# Patient Record
Sex: Female | Born: 1975 | Race: Black or African American | Hispanic: No | Marital: Married | Smoking: Never smoker
Health system: Southern US, Community
[De-identification: ages and names within clinical notes are randomized; demographics above are authoritative.]

## PROBLEM LIST (undated history)

## (undated) DIAGNOSIS — I959 Hypotension, unspecified: Secondary | ICD-10-CM

## (undated) DIAGNOSIS — E559 Vitamin D deficiency, unspecified: Secondary | ICD-10-CM

## (undated) DIAGNOSIS — D573 Sickle-cell trait: Secondary | ICD-10-CM

## (undated) DIAGNOSIS — K219 Gastro-esophageal reflux disease without esophagitis: Secondary | ICD-10-CM

## (undated) DIAGNOSIS — E039 Hypothyroidism, unspecified: Secondary | ICD-10-CM

## (undated) DIAGNOSIS — D709 Neutropenia, unspecified: Secondary | ICD-10-CM

## (undated) DIAGNOSIS — G4700 Insomnia, unspecified: Secondary | ICD-10-CM

## (undated) DIAGNOSIS — M791 Myalgia, unspecified site: Secondary | ICD-10-CM

## (undated) DIAGNOSIS — D259 Leiomyoma of uterus, unspecified: Secondary | ICD-10-CM

## (undated) DIAGNOSIS — Z9889 Other specified postprocedural states: Secondary | ICD-10-CM

## (undated) HISTORY — DX: Vitamin D deficiency, unspecified: E55.9

## (undated) HISTORY — DX: Hypotension, unspecified: I95.9

## (undated) HISTORY — DX: Hypothyroidism, unspecified: E03.9

## (undated) HISTORY — DX: Other specified postprocedural states: Z98.890

## (undated) HISTORY — DX: Leiomyoma of uterus, unspecified: D25.9

## (undated) HISTORY — DX: Myalgia, unspecified site: M79.10

## (undated) HISTORY — DX: Neutropenia, unspecified: D70.9

## (undated) HISTORY — DX: Gastro-esophageal reflux disease without esophagitis: K21.9

## (undated) HISTORY — PX: HEMORRHOID SURGERY: SHX153

## (undated) HISTORY — DX: Sickle-cell trait: D57.3

## (undated) HISTORY — DX: Insomnia, unspecified: G47.00

---

## 2005-01-23 ENCOUNTER — Ambulatory Visit: Payer: Self-pay | Admitting: Obstetrics and Gynecology

## 2005-05-22 ENCOUNTER — Inpatient Hospital Stay: Payer: Self-pay | Admitting: Obstetrics and Gynecology

## 2005-12-06 ENCOUNTER — Ambulatory Visit: Payer: Self-pay | Admitting: Internal Medicine

## 2007-06-04 ENCOUNTER — Ambulatory Visit: Payer: Self-pay | Admitting: Otolaryngology

## 2007-07-16 ENCOUNTER — Ambulatory Visit: Payer: Self-pay | Admitting: Otolaryngology

## 2007-07-30 ENCOUNTER — Inpatient Hospital Stay: Payer: Self-pay | Admitting: Endocrinology

## 2007-12-11 ENCOUNTER — Ambulatory Visit: Payer: Self-pay | Admitting: Internal Medicine

## 2008-04-16 HISTORY — PX: THYROIDECTOMY: SHX17

## 2009-04-24 ENCOUNTER — Emergency Department: Payer: Self-pay | Admitting: Emergency Medicine

## 2009-04-26 ENCOUNTER — Ambulatory Visit: Payer: Self-pay | Admitting: Emergency Medicine

## 2014-04-16 NOTE — L&D Delivery Note (Signed)
Delivery Summary for [facility]  Labor Events:   Preterm labor:   Rupture date:   Rupture time:   Rupture type: Artificial  Fluid Color: Clear  Induction:   Augmentation:   Complications:   Cervical ripening:          Delivery:   Episiotomy:   Lacerations:   Repair suture:   Repair # of packets:   Blood loss (ml): 500    Information for the patient's newborn:  Lolah, Tames [id number]    Delivery 03/20/2015 5:33 PM by  Vaginal, Spontaneous Delivery Sex:  female Gestational Age: [redacted]w[redacted]d Delivery Clinician:  [place]?: Yes        APGARS  One minute Five minutes Ten minutes  Skin color: 0   1      Heart rate: 2   2      Grimace: 2   2      Muscle tone: 2   2      Breathing: 2   2      Totals: 8  9      Presentation/position: Vertex  Left Occiput Posterior Resuscitation: None  Cord information: 3 vessels   Disposition of cord blood: Yes    Blood gases sent? No Complications: None  Placenta: Delivered: 03/20/2015 5:50 PM  Spontaneous  Intact appearance Newborn Measurements: Weight: 7 lb 12.3 oz (3525 g)  Height:    Head circumference:    Chest circumference:    Other providers: Delivery Nurse Registered Nurse Venita Lick Georganna Skeans  Additional  information: Forceps:   Vacuum:   Breech:   Observed anomalies         Delivery Note At 5:33 PM a viable and healthy female was delivered via Vaginal, Spontaneous Delivery (Presentation: Left Occiput Posterior).  APGAR: 8, 9; weight 7 lb 12.3 oz (3525 g).   Placenta status: Intact, Spontaneous.  Cord: 3 vessels with the following complications: None.  Cord pH: not obtained  Anesthesia: Epidural  Episiotomy: None Lacerations: None Suture Repair: None Est. Blood Loss (mL): 500  Mom to postpartum.  Baby to Couplet care / Skin to Skin.  [name] 03/20/2015, 6:26 PM    [name] 03/20/2015, 6:11 PM

## 2014-07-23 LAB — OB RESULTS CONSOLE ANTIBODY SCREEN: Antibody Screen: NEGATIVE

## 2014-07-23 LAB — OB RESULTS CONSOLE HIV ANTIBODY (ROUTINE TESTING): HIV: NONREACTIVE

## 2014-07-23 LAB — OB RESULTS CONSOLE HGB/HCT, BLOOD
HCT: 39 %
HEMOGLOBIN: 13.1 g/dL

## 2014-07-23 LAB — OB RESULTS CONSOLE ABO/RH: RH TYPE: POSITIVE

## 2014-07-23 LAB — OB RESULTS CONSOLE GC/CHLAMYDIA
CHLAMYDIA, DNA PROBE: NEGATIVE
GC PROBE AMP, GENITAL: NEGATIVE

## 2014-07-23 LAB — OB RESULTS CONSOLE HEPATITIS B SURFACE ANTIGEN: Hepatitis B Surface Ag: NEGATIVE

## 2014-07-23 LAB — OB RESULTS CONSOLE PLATELET COUNT: Platelets: 208 10*3/uL

## 2014-07-23 LAB — OB RESULTS CONSOLE VARICELLA ZOSTER ANTIBODY, IGG: Varicella: IMMUNE

## 2014-07-23 LAB — OB RESULTS CONSOLE RUBELLA ANTIBODY, IGM: Rubella: IMMUNE

## 2014-07-23 LAB — OB RESULTS CONSOLE RPR: RPR: NONREACTIVE

## 2014-09-20 ENCOUNTER — Encounter: Payer: Self-pay | Admitting: Obstetrics and Gynecology

## 2014-09-21 ENCOUNTER — Telehealth: Payer: Self-pay

## 2014-09-21 NOTE — Telephone Encounter (Signed)
Left message for pt to call back  °

## 2014-09-21 NOTE — Telephone Encounter (Signed)
-----   Message from [name], MD sent at 09/20/2014  9:42 AM EDT ----- Regarding: Panorama results Please inform patient of normal Panorama testing, is female sex if she desires to know sex of baby.   ----- Message -----    From: [facility]    Sent: 09/20/2014   8:12 AM      To: [name], MD

## 2014-09-23 ENCOUNTER — Telehealth: Payer: Self-pay

## 2014-09-23 NOTE — Telephone Encounter (Signed)
-----   Message from [name], MD sent at 09/20/2014  9:42 AM EDT ----- Regarding: Panorama results Please inform patient of normal Panorama testing, is female sex if she desires to know sex of baby.   ----- Message -----    From: [facility]    Sent: 09/20/2014   8:12 AM      To: [name], MD

## 2014-09-23 NOTE — Telephone Encounter (Signed)
Called pt again today, initial cal 09/21/2014. No answer. Voicemail left for pt to call back.

## 2014-09-24 ENCOUNTER — Telehealth: Payer: Self-pay

## 2014-09-24 NOTE — Telephone Encounter (Signed)
-----   Message from [name], MD sent at 09/20/2014  9:42 AM EDT ----- Regarding: Panorama results Please inform patient of normal Panorama testing, is female sex if she desires to know sex of baby.   ----- Message -----    From: [facility]    Sent: 09/20/2014   8:12 AM      To: [name], MD

## 2014-09-24 NOTE — Telephone Encounter (Signed)
Called pt on cell, informed her of results below.

## 2014-09-24 NOTE — Telephone Encounter (Signed)
Pt informed of results below.

## 2014-10-13 ENCOUNTER — Ambulatory Visit (INDEPENDENT_AMBULATORY_CARE_PROVIDER_SITE_OTHER): Payer: BLUE CROSS/BLUE SHIELD | Admitting: Obstetrics and Gynecology

## 2014-10-13 ENCOUNTER — Encounter: Payer: Self-pay | Admitting: Obstetrics and Gynecology

## 2014-10-13 VITALS — BP 112/69 | HR 90 | Ht 65.0 in | Wt 178.1 lb

## 2014-10-13 DIAGNOSIS — Z3482 Encounter for supervision of other normal pregnancy, second trimester: Secondary | ICD-10-CM

## 2014-10-13 DIAGNOSIS — E039 Hypothyroidism, unspecified: Secondary | ICD-10-CM | POA: Diagnosis present

## 2014-10-13 DIAGNOSIS — Z3492 Encounter for supervision of normal pregnancy, unspecified, second trimester: Secondary | ICD-10-CM

## 2014-10-13 DIAGNOSIS — D259 Leiomyoma of uterus, unspecified: Secondary | ICD-10-CM

## 2014-10-13 NOTE — Progress Notes (Signed)
ROB: Patient denies complaints.  Nausea has finally resolved.  Patient to f/u in 2 weeks for anatomy scan, 4 weeks for ROB. Continue to monitor fibroid growth in pregnancy. Discussed need for serial ultrasounds in late 2nd and 3rd trimesters for monitoring growth of fetus and fibroids.  Fibroid palpable today, ~ 6-7 cm, fundal.

## 2014-10-20 ENCOUNTER — Other Ambulatory Visit: Payer: Self-pay

## 2014-10-20 ENCOUNTER — Ambulatory Visit: Payer: BLUE CROSS/BLUE SHIELD

## 2014-10-20 DIAGNOSIS — Z3492 Encounter for supervision of normal pregnancy, unspecified, second trimester: Secondary | ICD-10-CM

## 2014-10-20 DIAGNOSIS — Z3482 Encounter for supervision of other normal pregnancy, second trimester: Secondary | ICD-10-CM | POA: Diagnosis not present

## 2014-10-20 DIAGNOSIS — D259 Leiomyoma of uterus, unspecified: Secondary | ICD-10-CM | POA: Diagnosis not present

## 2014-11-10 ENCOUNTER — Ambulatory Visit (INDEPENDENT_AMBULATORY_CARE_PROVIDER_SITE_OTHER): Payer: BLUE CROSS/BLUE SHIELD | Admitting: Obstetrics and Gynecology

## 2014-11-10 ENCOUNTER — Encounter: Payer: Self-pay | Admitting: Obstetrics and Gynecology

## 2014-11-10 VITALS — BP 110/70 | HR 91 | Wt 183.0 lb

## 2014-11-10 DIAGNOSIS — Z3482 Encounter for supervision of other normal pregnancy, second trimester: Secondary | ICD-10-CM

## 2014-11-10 DIAGNOSIS — Z3492 Encounter for supervision of normal pregnancy, unspecified, second trimester: Secondary | ICD-10-CM

## 2014-11-10 DIAGNOSIS — Z131 Encounter for screening for diabetes mellitus: Secondary | ICD-10-CM

## 2014-11-10 DIAGNOSIS — O09522 Supervision of elderly multigravida, second trimester: Secondary | ICD-10-CM

## 2014-11-10 DIAGNOSIS — N2889 Other specified disorders of kidney and ureter: Secondary | ICD-10-CM

## 2014-11-10 DIAGNOSIS — O09529 Supervision of elderly multigravida, unspecified trimester: Secondary | ICD-10-CM | POA: Insufficient documentation

## 2014-11-10 LAB — POCT URINALYSIS DIPSTICK
BILIRUBIN UA: NEGATIVE
Blood, UA: NEGATIVE
GLUCOSE UA: NEGATIVE
Ketones, UA: NEGATIVE
Leukocytes, UA: NEGATIVE
Nitrite, UA: NEGATIVE
PH UA: 7.5
PROTEIN UA: NEGATIVE
UROBILINOGEN UA: NEGATIVE

## 2014-11-11 NOTE — Progress Notes (Signed)
ROB: Patient doing well.  Denies complaints except for mild fatigue.  Continue to monitor fetal growth with fibroid uterus.  Next scan to be at 26-[redacted] weeks gestation. RTC in 4 weeks. Most recent scan notes normal fetal growth.  But dilated renal pelvises.  Will refer to MFM for consultation and f/u.  RTC in 4 weeks.

## 2014-11-22 ENCOUNTER — Ambulatory Visit
Admission: RE | Admit: 2014-11-22 | Discharge: 2014-11-22 | Disposition: A | Discharge status: Home / Self Care | Payer: BLUE CROSS/BLUE SHIELD | Source: Ambulatory Visit | Attending: Obstetrics and Gynecology | Admitting: Obstetrics and Gynecology

## 2014-11-22 ENCOUNTER — Ambulatory Visit: Admission: RE | Admit: 2014-11-22 | Payer: BLUE CROSS/BLUE SHIELD | Source: Ambulatory Visit

## 2014-11-22 DIAGNOSIS — O09512 Supervision of elderly primigravida, second trimester: Secondary | ICD-10-CM | POA: Diagnosis not present

## 2014-11-22 DIAGNOSIS — O359XX0 Maternal care for (suspected) fetal abnormality and damage, unspecified, not applicable or unspecified: Secondary | ICD-10-CM | POA: Diagnosis present

## 2014-11-22 DIAGNOSIS — Z3A23 23 weeks gestation of pregnancy: Secondary | ICD-10-CM | POA: Insufficient documentation

## 2014-11-22 LAB — US OB DETAIL + 14 WK

## 2014-12-08 ENCOUNTER — Other Ambulatory Visit: Payer: Self-pay

## 2014-12-08 ENCOUNTER — Ambulatory Visit (INDEPENDENT_AMBULATORY_CARE_PROVIDER_SITE_OTHER): Payer: BLUE CROSS/BLUE SHIELD | Admitting: Obstetrics and Gynecology

## 2014-12-08 VITALS — BP 105/64 | HR 88 | Wt 184.4 lb

## 2014-12-08 DIAGNOSIS — O3412 Maternal care for benign tumor of corpus uteri, second trimester: Secondary | ICD-10-CM

## 2014-12-08 DIAGNOSIS — D259 Leiomyoma of uterus, unspecified: Secondary | ICD-10-CM | POA: Insufficient documentation

## 2014-12-08 DIAGNOSIS — Z3492 Encounter for supervision of normal pregnancy, unspecified, second trimester: Secondary | ICD-10-CM

## 2014-12-08 DIAGNOSIS — O09522 Supervision of elderly multigravida, second trimester: Secondary | ICD-10-CM

## 2014-12-08 DIAGNOSIS — Z131 Encounter for screening for diabetes mellitus: Secondary | ICD-10-CM

## 2014-12-08 LAB — POCT URINALYSIS DIPSTICK
Bilirubin, UA: NEGATIVE
Blood, UA: NEGATIVE
Glucose, UA: NEGATIVE
Ketones, UA: NEGATIVE
LEUKOCYTES UA: NEGATIVE
Nitrite, UA: NEGATIVE
Protein, UA: NEGATIVE
SPEC GRAV UA: 1.015
UROBILINOGEN UA: NEGATIVE
pH, UA: 5

## 2014-12-08 NOTE — Progress Notes (Signed)
ROB: Patient doing well. Notes increased pelvic pressure. Denies contractions or pain.  S/p MFM consult for dilated renal pelvises, no further workup required. Glucola performed today.  RTC in 2 weeks. To sign blood consent and Tdap next visit. For growth scan next visit.

## 2014-12-09 LAB — HEMOGLOBIN AND HEMATOCRIT, BLOOD
Hematocrit: 32.5 % — ABNORMAL LOW (ref 34.0–46.6)
Hemoglobin: 11.2 g/dL (ref 11.1–15.9)

## 2014-12-09 LAB — GLUCOSE TOLERANCE, 1 HOUR: Glucose, 1Hr PP: 85 mg/dL (ref 65–199)

## 2014-12-10 ENCOUNTER — Telehealth: Payer: Self-pay

## 2014-12-10 NOTE — Telephone Encounter (Signed)
-----   Message from [name], MD sent at 12/09/2014 11:35 AM EDT ----- Please inform patient of normal glucola screen

## 2014-12-13 NOTE — Telephone Encounter (Signed)
-----   Message from [name], MD sent at 12/09/2014 11:35 AM EDT ----- Please inform patient of normal glucola screen

## 2014-12-14 NOTE — Telephone Encounter (Signed)
Pt aware of normal glucola screen.

## 2014-12-21 ENCOUNTER — Ambulatory Visit (INDEPENDENT_AMBULATORY_CARE_PROVIDER_SITE_OTHER): Payer: BLUE CROSS/BLUE SHIELD | Admitting: Obstetrics and Gynecology

## 2014-12-21 ENCOUNTER — Encounter: Payer: Self-pay | Admitting: Obstetrics and Gynecology

## 2014-12-21 ENCOUNTER — Ambulatory Visit: Payer: BLUE CROSS/BLUE SHIELD

## 2014-12-21 VITALS — BP 102/65 | HR 94 | Wt 186.8 lb

## 2014-12-21 DIAGNOSIS — Z3482 Encounter for supervision of other normal pregnancy, second trimester: Secondary | ICD-10-CM

## 2014-12-21 DIAGNOSIS — Z3492 Encounter for supervision of normal pregnancy, unspecified, second trimester: Secondary | ICD-10-CM

## 2014-12-21 DIAGNOSIS — D259 Leiomyoma of uterus, unspecified: Secondary | ICD-10-CM

## 2014-12-21 DIAGNOSIS — O3412 Maternal care for benign tumor of corpus uteri, second trimester: Principal | ICD-10-CM

## 2014-12-21 LAB — POCT URINALYSIS DIPSTICK
Bilirubin, UA: NEGATIVE
Blood, UA: NEGATIVE
Glucose, UA: NEGATIVE
KETONES UA: NEGATIVE
LEUKOCYTES UA: NEGATIVE
Nitrite, UA: NEGATIVE
Protein, UA: NEGATIVE
SPEC GRAV UA: 1.015
Urobilinogen, UA: NEGATIVE
pH, UA: 6

## 2014-12-21 NOTE — Progress Notes (Signed)
ROB:  Patient doing well. Denies complaints.  Growth scan today with normal growth, right mildly dilated renal pelvis, large fundal fibroid (9 cm). Will consider Tdap and flu vaccine for next visit.  Blood consent signed. Needs letter stating patient can travel. Going to [place] x 1 week starting next week. Given flight precautions, PTL precautions. RTC in 2-3 weeks once returned. Final growth scan to be completed at [redacted] weeks gestation.

## 2015-01-07 ENCOUNTER — Telehealth: Payer: Self-pay | Admitting: Obstetrics and Gynecology

## 2015-01-07 ENCOUNTER — Encounter: Payer: Self-pay | Admitting: Obstetrics and Gynecology

## 2015-01-07 ENCOUNTER — Ambulatory Visit (INDEPENDENT_AMBULATORY_CARE_PROVIDER_SITE_OTHER): Payer: BLUE CROSS/BLUE SHIELD | Admitting: Obstetrics and Gynecology

## 2015-01-07 VITALS — BP 100/48 | HR 78 | Temp 99.2°F | Wt 186.6 lb

## 2015-01-07 DIAGNOSIS — Z3493 Encounter for supervision of normal pregnancy, unspecified, third trimester: Secondary | ICD-10-CM

## 2015-01-07 DIAGNOSIS — J069 Acute upper respiratory infection, unspecified: Secondary | ICD-10-CM

## 2015-01-07 LAB — POCT URINALYSIS DIPSTICK
Bilirubin, UA: NEGATIVE
GLUCOSE UA: NEGATIVE
Ketones, UA: NEGATIVE
Leukocytes, UA: NEGATIVE
Nitrite, UA: NEGATIVE
RBC UA: NEGATIVE
SPEC GRAV UA: 1.01
UROBILINOGEN UA: 0.2
pH, UA: 6

## 2015-01-07 MED ORDER — HYDROCOD POLST-CPM POLST ER 10-8 MG/5ML PO SUER: 5.0000 mL | Freq: Every evening | ORAL | Status: DC | PRN

## 2015-01-07 MED ORDER — CEFDINIR 300 MG PO CAPS: 300.0000 mg | ORAL_CAPSULE | Freq: Two times a day (BID) | ORAL | Status: DC

## 2015-01-07 NOTE — Progress Notes (Signed)
Problem OB- reports cough with green congestion x 5-6 days, malaise and sinus drainage, HA, not getting better with tylenol and robitussin, both sons have same. No concerns with pregancy. HRR Lungs with some rhonchi bilaterally and productive cough noted. Sinus clear, negative lymphadenopathy

## 2015-01-07 NOTE — Patient Instructions (Signed)
Upper Respiratory Infection, Adult An upper respiratory infection (URI) is also sometimes known as the common cold. The upper respiratory tract includes the nose, sinuses, throat, trachea, and bronchi. Bronchi are the airways leading to the lungs. Most people improve within 1 week, but symptoms can last up to 2 weeks. A residual cough may last even longer.  CAUSES Many different viruses can infect the tissues lining the upper respiratory tract. The tissues become irritated and inflamed and often become very moist. Mucus production is also common. A cold is contagious. You can easily spread the virus to others by oral contact. This includes kissing, sharing a glass, coughing, or sneezing. Touching your mouth or nose and then touching a surface, which is then touched by another person, can also spread the virus. SYMPTOMS  Symptoms typically develop 1 to 3 days after you come in contact with a cold virus. Symptoms vary from person to person. They may include:  Runny nose.  Sneezing.  Nasal congestion.  Sinus irritation.  Sore throat.  Loss of voice (laryngitis).  Cough.  Fatigue.  Muscle aches.  Loss of appetite.  Headache.  Low-grade fever. DIAGNOSIS  You might diagnose your own cold based on familiar symptoms, since most people get a cold 2 to 3 times a year. Your caregiver can confirm this based on your exam. Most importantly, your caregiver can check that your symptoms are not due to another disease such as strep throat, sinusitis, pneumonia, asthma, or epiglottitis. Blood tests, throat tests, and X-rays are not necessary to diagnose a common cold, but they may sometimes be helpful in excluding other more serious diseases. Your caregiver will decide if any further tests are required. RISKS AND COMPLICATIONS  You may be at risk for a more severe case of the common cold if you smoke cigarettes, have chronic heart disease (such as heart failure) or lung disease (such as asthma), or if  you have a weakened immune system. The very young and very old are also at risk for more serious infections. Bacterial sinusitis, middle ear infections, and bacterial pneumonia can complicate the common cold. The common cold can worsen asthma and chronic obstructive pulmonary disease (COPD). Sometimes, these complications can require emergency medical care and may be life-threatening. PREVENTION  The best way to protect against getting a cold is to practice good hygiene. Avoid oral or hand contact with people with cold symptoms. Wash your hands often if contact occurs. There is no clear evidence that vitamin C, vitamin E, echinacea, or exercise reduces the chance of developing a cold. However, it is always recommended to get plenty of rest and practice good nutrition. TREATMENT  Treatment is directed at relieving symptoms. There is no cure. Antibiotics are not effective, because the infection is caused by a virus, not by bacteria. Treatment may include:  Increased fluid intake. Sports drinks offer valuable electrolytes, sugars, and fluids.  Breathing heated mist or steam (vaporizer or shower).  Eating chicken soup or other clear broths, and maintaining good nutrition.  Getting plenty of rest.  Using gargles or lozenges for comfort.  Controlling fevers with ibuprofen or acetaminophen as directed by your caregiver.  Increasing usage of your inhaler if you have asthma. Zinc gel and zinc lozenges, taken in the first 24 hours of the common cold, can shorten the duration and lessen the severity of symptoms. Pain medicines may help with fever, muscle aches, and throat pain. A variety of non-prescription medicines are available to treat congestion and runny nose. Your caregiver   can make recommendations and may suggest nasal or lung inhalers for other symptoms.  HOME CARE INSTRUCTIONS   Only take over-the-counter or prescription medicines for pain, discomfort, or fever as directed by your  caregiver.  Use a warm mist humidifier or inhale steam from a shower to increase air moisture. This may keep secretions moist and make it easier to breathe.  Drink enough water and fluids to keep your urine clear or pale yellow.  Rest as needed.  Return to work when your temperature has returned to normal or as your caregiver advises. You may need to stay home longer to avoid infecting others. You can also use a face mask and careful hand washing to prevent spread of the virus. SEEK MEDICAL CARE IF:   After the first few days, you feel you are getting worse rather than better.  You need your caregiver's advice about medicines to control symptoms.  You develop chills, worsening shortness of breath, or brown or red sputum. These may be signs of pneumonia.  You develop yellow or brown nasal discharge or pain in the face, especially when you bend forward. These may be signs of sinusitis.  You develop a fever, swollen neck glands, pain with swallowing, or white areas in the back of your throat. These may be signs of strep throat. SEEK IMMEDIATE MEDICAL CARE IF:   You have a fever.  You develop severe or persistent headache, ear pain, sinus pain, or chest pain.  You develop wheezing, a prolonged cough, cough up blood, or have a change in your usual mucus (if you have chronic lung disease).  You develop sore muscles or a stiff neck. Document Released: 09/26/2000 Document Revised: 06/25/2011 Document Reviewed: 07/08/2013 ExitCare Patient Information 2015 [employer]. This information is not intended to replace advice given to you by your health care provider. Make sure you discuss any questions you have with your health care provider.  

## 2015-01-07 NOTE — Telephone Encounter (Signed)
Pt is 7 mos pregnant. She has a cold in her chest, thick mucus, coughing nonstop. Robitussin not helping at all. She wants to know if she can have anything else.

## 2015-01-07 NOTE — Telephone Encounter (Signed)
Pt states she has had a cold x 6 days. Thick green mucous. Coughing all nite and unable to sleep. Chest hurts from all the coughing. Runny nose. NO fever or body aches. Plain robutussin is not helping. Appt made TODAY. Ok per [facility].

## 2015-01-07 NOTE — Progress Notes (Signed)
OB work in- c/o sinus drainage x 1 week, chest congestion- thick green phlem

## 2015-01-12 ENCOUNTER — Ambulatory Visit (INDEPENDENT_AMBULATORY_CARE_PROVIDER_SITE_OTHER): Payer: BLUE CROSS/BLUE SHIELD | Admitting: Obstetrics and Gynecology

## 2015-01-12 ENCOUNTER — Encounter: Payer: Self-pay | Admitting: Obstetrics and Gynecology

## 2015-01-12 VITALS — BP 113/68 | HR 82 | Wt 188.4 lb

## 2015-01-12 DIAGNOSIS — O09523 Supervision of elderly multigravida, third trimester: Secondary | ICD-10-CM

## 2015-01-12 DIAGNOSIS — D259 Leiomyoma of uterus, unspecified: Secondary | ICD-10-CM

## 2015-01-12 DIAGNOSIS — Z3493 Encounter for supervision of normal pregnancy, unspecified, third trimester: Secondary | ICD-10-CM

## 2015-01-12 DIAGNOSIS — J069 Acute upper respiratory infection, unspecified: Secondary | ICD-10-CM

## 2015-01-12 DIAGNOSIS — O3410 Maternal care for benign tumor of corpus uteri, unspecified trimester: Secondary | ICD-10-CM

## 2015-01-12 LAB — POCT URINALYSIS DIPSTICK
BILIRUBIN UA: NEGATIVE
Blood, UA: NEGATIVE
Glucose, UA: NEGATIVE
KETONES UA: NEGATIVE
LEUKOCYTES UA: NEGATIVE
Nitrite, UA: NEGATIVE
Protein, UA: NEGATIVE
Urobilinogen, UA: 0.2
pH, UA: 7

## 2015-01-12 MED ORDER — DOCUSATE SODIUM 100 MG PO CAPS: 100.0000 mg | ORAL_CAPSULE | Freq: Two times a day (BID) | ORAL | Status: DC

## 2015-01-12 NOTE — Progress Notes (Signed)
Pt c/o of constipation. Constipation protocol given. D/t sickness will give vaccines NV.

## 2015-01-13 NOTE — Progress Notes (Signed)
ROB: Patient reports cold symptoms improving. For next growth scan at 34-36 weeks for large uterine fibroid in pregnancy. RTC in 2 weeks. For Tdap and flu vaccine after URI symptoms resolved.

## 2015-01-26 ENCOUNTER — Ambulatory Visit (INDEPENDENT_AMBULATORY_CARE_PROVIDER_SITE_OTHER): Payer: BLUE CROSS/BLUE SHIELD | Admitting: Obstetrics and Gynecology

## 2015-01-26 VITALS — BP 101/65 | HR 92 | Wt 191.6 lb

## 2015-01-26 DIAGNOSIS — O3413 Maternal care for benign tumor of corpus uteri, third trimester: Secondary | ICD-10-CM

## 2015-01-26 DIAGNOSIS — Z23 Encounter for immunization: Secondary | ICD-10-CM

## 2015-01-26 DIAGNOSIS — D259 Leiomyoma of uterus, unspecified: Secondary | ICD-10-CM

## 2015-01-26 DIAGNOSIS — Z331 Pregnant state, incidental: Secondary | ICD-10-CM

## 2015-01-26 LAB — POCT URINALYSIS DIPSTICK
Bilirubin, UA: NEGATIVE
Blood, UA: NEGATIVE
Glucose, UA: NEGATIVE
Ketones, UA: NEGATIVE
Leukocytes, UA: NEGATIVE
Nitrite, UA: NEGATIVE
Protein, UA: NEGATIVE
Spec Grav, UA: 1.01
Urobilinogen, UA: NEGATIVE
pH, UA: 7.5

## 2015-01-26 MED ORDER — TETANUS-DIPHTH-ACELL PERTUSSIS 5-2.5-18.5 LF-MCG/0.5 IM SUSP
0.5000 mL | Freq: Once | INTRAMUSCULAR | Status: AC
  Administered 2015-01-26: 0.5 mL via INTRAMUSCULAR

## 2015-01-26 NOTE — Progress Notes (Signed)
ROB: Denies complaints. Does note occasional Braxton hicks. Tdap given, declines flu vaccine.  For growth scan next visit for fibroid uterus in pregnancy. PTL precautions given.  Desires to discuss pain management with anesthesia due to complications/SE from epidural last pregnancy. Will arrange. RTC in 2 weeks.

## 2015-01-26 NOTE — Progress Notes (Signed)
Pt wants to wait till next visit to get flu vaccine but is really unsure. Will give Tdap injection.

## 2015-02-01 ENCOUNTER — Telehealth: Payer: Self-pay | Admitting: *Deleted

## 2015-02-01 NOTE — OR Nursing (Addendum)
Spoke with patient at request of Dr Marcelline Mates. Patient had epidural 2007 for delivery and experienced itching for a week. Strongest dose of Benadryl did not help. Obtained labor and delivery summary and MAR. Spoke with Dr Assunta Gambles who requested patient not be given Astramorph.if epidural this delivery. She is due 03/15/15. Notified patient and added to allergies.

## 2015-02-10 ENCOUNTER — Ambulatory Visit: Payer: BLUE CROSS/BLUE SHIELD

## 2015-02-10 ENCOUNTER — Ambulatory Visit (INDEPENDENT_AMBULATORY_CARE_PROVIDER_SITE_OTHER): Payer: BLUE CROSS/BLUE SHIELD | Admitting: Obstetrics and Gynecology

## 2015-02-10 VITALS — BP 106/71 | HR 89 | Wt 187.8 lb

## 2015-02-10 DIAGNOSIS — D259 Leiomyoma of uterus, unspecified: Secondary | ICD-10-CM

## 2015-02-10 DIAGNOSIS — O3410 Maternal care for benign tumor of corpus uteri, unspecified trimester: Secondary | ICD-10-CM

## 2015-02-10 DIAGNOSIS — O3413 Maternal care for benign tumor of corpus uteri, third trimester: Principal | ICD-10-CM

## 2015-02-10 DIAGNOSIS — Z3493 Encounter for supervision of normal pregnancy, unspecified, third trimester: Secondary | ICD-10-CM

## 2015-02-10 DIAGNOSIS — O09523 Supervision of elderly multigravida, third trimester: Secondary | ICD-10-CM

## 2015-02-10 LAB — POCT URINALYSIS DIP (MANUAL ENTRY)
Bilirubin, UA: NEGATIVE
Blood, UA: NEGATIVE
Glucose, UA: NEGATIVE
Ketones, POC UA: NEGATIVE
Leukocytes, UA: NEGATIVE
Nitrite, UA: NEGATIVE
Protein Ur, POC: NEGATIVE
Spec Grav, UA: 1.015
Urobilinogen, UA: 0.2
pH, UA: 7

## 2015-02-10 MED ORDER — AMEDA ELITE BREAST PUMP MISC: 1.0000 | Status: DC | PRN

## 2015-02-10 NOTE — Progress Notes (Signed)
ROB: Denies major complaints.  S/p growth scan with growth in 42%ile (5 lb 11 oz), fibroids enlarging (largest one 11 cm), transverse fetal presentation. 36 week labs done today.  Discussed fetal presentation, can watch for additional 2 weeks to see if fetus changes to vertex.  Otherwise may have to discuss ECV vs C-section as mode of delivery. RTC in 1 week.

## 2015-02-14 LAB — CULTURE, BETA STREP (GROUP B ONLY): Strep Gp B Culture: NEGATIVE

## 2015-02-15 LAB — NUSWAB VAGINITIS PLUS (VG+)

## 2015-02-16 ENCOUNTER — Encounter: Payer: BLUE CROSS/BLUE SHIELD | Admitting: Obstetrics and Gynecology

## 2015-02-24 ENCOUNTER — Ambulatory Visit (INDEPENDENT_AMBULATORY_CARE_PROVIDER_SITE_OTHER): Payer: BLUE CROSS/BLUE SHIELD | Admitting: Obstetrics and Gynecology

## 2015-02-24 VITALS — BP 110/65 | HR 87 | Wt 191.1 lb

## 2015-02-24 DIAGNOSIS — O3410 Maternal care for benign tumor of corpus uteri, unspecified trimester: Secondary | ICD-10-CM

## 2015-02-24 DIAGNOSIS — D259 Leiomyoma of uterus, unspecified: Secondary | ICD-10-CM

## 2015-02-24 DIAGNOSIS — Z3493 Encounter for supervision of normal pregnancy, unspecified, third trimester: Secondary | ICD-10-CM

## 2015-02-24 DIAGNOSIS — O09523 Supervision of elderly multigravida, third trimester: Secondary | ICD-10-CM

## 2015-02-24 DIAGNOSIS — O321XX0 Maternal care for breech presentation, not applicable or unspecified: Secondary | ICD-10-CM

## 2015-02-24 LAB — POCT URINALYSIS DIPSTICK
Bilirubin, UA: NEGATIVE
Blood, UA: NEGATIVE
Glucose, UA: NEGATIVE
KETONES UA: 5
Leukocytes, UA: NEGATIVE
Nitrite, UA: NEGATIVE
PH UA: 6.5
PROTEIN UA: NEGATIVE
SPEC GRAV UA: 1.01
UROBILINOGEN UA: NEGATIVE

## 2015-02-24 NOTE — Progress Notes (Signed)
ROB: Patient doing well, notes irregular ctx and pressure.  Inquires about private cord blood banking. Given information.  36 week labs including GBS.  Labor precautions.  Will order [place] for presentation as unable to determine on today's exam. Was transverse lie on prior scan 2 weeks ago.   Discussed possibility of attempting ECV, including risks/benefits.  RTC in 1 week.

## 2015-03-03 ENCOUNTER — Ambulatory Visit: Payer: BLUE CROSS/BLUE SHIELD

## 2015-03-03 ENCOUNTER — Ambulatory Visit (INDEPENDENT_AMBULATORY_CARE_PROVIDER_SITE_OTHER): Payer: BLUE CROSS/BLUE SHIELD | Admitting: Obstetrics and Gynecology

## 2015-03-03 VITALS — BP 112/66 | HR 85 | Wt 189.0 lb

## 2015-03-03 DIAGNOSIS — D259 Leiomyoma of uterus, unspecified: Secondary | ICD-10-CM

## 2015-03-03 DIAGNOSIS — O321XX0 Maternal care for breech presentation, not applicable or unspecified: Secondary | ICD-10-CM | POA: Diagnosis not present

## 2015-03-03 DIAGNOSIS — Z3493 Encounter for supervision of normal pregnancy, unspecified, third trimester: Secondary | ICD-10-CM

## 2015-03-03 DIAGNOSIS — O3410 Maternal care for benign tumor of corpus uteri, unspecified trimester: Secondary | ICD-10-CM

## 2015-03-03 DIAGNOSIS — O09523 Supervision of elderly multigravida, third trimester: Secondary | ICD-10-CM

## 2015-03-03 LAB — POCT URINALYSIS DIPSTICK
Bilirubin, UA: NEGATIVE
Blood, UA: NEGATIVE
GLUCOSE UA: NEGATIVE
Ketones, UA: NEGATIVE
LEUKOCYTES UA: NEGATIVE
Nitrite, UA: NEGATIVE
PH UA: 7.5
Protein, UA: NEGATIVE
Spec Grav, UA: 1.01
UROBILINOGEN UA: NEGATIVE

## 2015-03-03 NOTE — Progress Notes (Signed)
ROB: Patient notes no sleep overnight, very uncomfortable. Cervical change noted.  Presentation scan notes fetus now vertex.  Labor precautions and fetal kick counts given. RTC in 1 week. Marland Kitchen

## 2015-03-09 ENCOUNTER — Encounter: Payer: Self-pay | Admitting: Obstetrics and Gynecology

## 2015-03-09 ENCOUNTER — Ambulatory Visit (INDEPENDENT_AMBULATORY_CARE_PROVIDER_SITE_OTHER): Payer: BLUE CROSS/BLUE SHIELD | Admitting: Obstetrics and Gynecology

## 2015-03-09 VITALS — BP 115/73 | HR 118 | Wt 190.0 lb

## 2015-03-09 DIAGNOSIS — Z3490 Encounter for supervision of normal pregnancy, unspecified, unspecified trimester: Secondary | ICD-10-CM

## 2015-03-09 DIAGNOSIS — Z331 Pregnant state, incidental: Secondary | ICD-10-CM

## 2015-03-09 DIAGNOSIS — O09523 Supervision of elderly multigravida, third trimester: Secondary | ICD-10-CM

## 2015-03-09 DIAGNOSIS — O3410 Maternal care for benign tumor of corpus uteri, unspecified trimester: Secondary | ICD-10-CM

## 2015-03-09 DIAGNOSIS — D259 Leiomyoma of uterus, unspecified: Secondary | ICD-10-CM

## 2015-03-09 LAB — POCT URINALYSIS DIPSTICK
Bilirubin, UA: NEGATIVE
Glucose, UA: NEGATIVE
Leukocytes, UA: NEGATIVE
Nitrite, UA: NEGATIVE
PROTEIN UA: NEGATIVE
RBC UA: NEGATIVE
SPEC GRAV UA: 1.025
UROBILINOGEN UA: NEGATIVE
pH, UA: 6

## 2015-03-09 NOTE — Progress Notes (Signed)
ROB: Patient doing well, notes irregular contractions.  Cervix unchanged since prior visit.  Labor precautions given.  RTC in1 week if undelivered.

## 2015-03-13 LAB — OB RESULTS CONSOLE GBS: STREP GROUP B AG: NEGATIVE

## 2015-03-16 ENCOUNTER — Ambulatory Visit (INDEPENDENT_AMBULATORY_CARE_PROVIDER_SITE_OTHER): Payer: BLUE CROSS/BLUE SHIELD | Admitting: Obstetrics and Gynecology

## 2015-03-16 VITALS — BP 101/64 | HR 81 | Wt 192.1 lb

## 2015-03-16 DIAGNOSIS — Z3403 Encounter for supervision of normal first pregnancy, third trimester: Secondary | ICD-10-CM

## 2015-03-16 NOTE — Progress Notes (Signed)
ROB: Patient notes contractions, however not consistent. Cervix remains unchanged (very posterior now) and unable to determine fetal presentation.  Ultrasound notes cephalic presentation.  Patient now inquires about elective IOL as she now desires.  Scheduled for 03/19/2015 (evening).

## 2015-03-19 ENCOUNTER — Inpatient Hospital Stay
Admission: EM | Admit: 2015-03-19 | Discharge: 2015-03-22 | DRG: 774 | Disposition: A | Discharge status: Home / Self Care | Payer: BLUE CROSS/BLUE SHIELD | Attending: Obstetrics and Gynecology | Admitting: Obstetrics and Gynecology

## 2015-03-19 DIAGNOSIS — E039 Hypothyroidism, unspecified: Secondary | ICD-10-CM | POA: Diagnosis present

## 2015-03-19 DIAGNOSIS — Z8249 Family history of ischemic heart disease and other diseases of the circulatory system: Secondary | ICD-10-CM

## 2015-03-19 DIAGNOSIS — O480 Post-term pregnancy: Secondary | ICD-10-CM | POA: Diagnosis present

## 2015-03-19 DIAGNOSIS — O9902 Anemia complicating childbirth: Secondary | ICD-10-CM | POA: Diagnosis present

## 2015-03-19 DIAGNOSIS — O9912 Other diseases of the blood and blood-forming organs and certain disorders involving the immune mechanism complicating childbirth: Principal | ICD-10-CM | POA: Diagnosis present

## 2015-03-19 DIAGNOSIS — Z800 Family history of malignant neoplasm of digestive organs: Secondary | ICD-10-CM

## 2015-03-19 DIAGNOSIS — Z9889 Other specified postprocedural states: Secondary | ICD-10-CM

## 2015-03-19 DIAGNOSIS — D259 Leiomyoma of uterus, unspecified: Secondary | ICD-10-CM | POA: Diagnosis present

## 2015-03-19 DIAGNOSIS — Z3483 Encounter for supervision of other normal pregnancy, third trimester: Secondary | ICD-10-CM | POA: Diagnosis not present

## 2015-03-19 DIAGNOSIS — Z79899 Other long term (current) drug therapy: Secondary | ICD-10-CM

## 2015-03-19 DIAGNOSIS — O99284 Endocrine, nutritional and metabolic diseases complicating childbirth: Secondary | ICD-10-CM | POA: Diagnosis present

## 2015-03-19 DIAGNOSIS — O09529 Supervision of elderly multigravida, unspecified trimester: Secondary | ICD-10-CM

## 2015-03-19 DIAGNOSIS — Z3A40 40 weeks gestation of pregnancy: Secondary | ICD-10-CM | POA: Diagnosis not present

## 2015-03-19 DIAGNOSIS — D696 Thrombocytopenia, unspecified: Secondary | ICD-10-CM | POA: Diagnosis present

## 2015-03-19 DIAGNOSIS — O99119 Other diseases of the blood and blood-forming organs and certain disorders involving the immune mechanism complicating pregnancy, unspecified trimester: Secondary | ICD-10-CM

## 2015-03-19 DIAGNOSIS — D6959 Other secondary thrombocytopenia: Secondary | ICD-10-CM | POA: Diagnosis present

## 2015-03-19 DIAGNOSIS — O3413 Maternal care for benign tumor of corpus uteri, third trimester: Secondary | ICD-10-CM | POA: Diagnosis present

## 2015-03-19 DIAGNOSIS — Z888 Allergy status to other drugs, medicaments and biological substances status: Secondary | ICD-10-CM | POA: Diagnosis not present

## 2015-03-19 LAB — CBC
HCT: 33.7 % — ABNORMAL LOW (ref 35.0–47.0)
HEMOGLOBIN: 11.3 g/dL — AB (ref 12.0–16.0)
MCH: 27.6 pg (ref 26.0–34.0)
MCHC: 33.4 g/dL (ref 32.0–36.0)
MCV: 82.6 fL (ref 80.0–100.0)
PLATELETS: 125 10*3/uL — AB (ref 150–440)
RBC: 4.08 MIL/uL (ref 3.80–5.20)
RDW: 14.1 % (ref 11.5–14.5)
WBC: 6.6 10*3/uL (ref 3.6–11.0)

## 2015-03-19 LAB — TYPE AND SCREEN
ABO/RH(D): B POS
Antibody Screen: NEGATIVE

## 2015-03-19 LAB — ABO/RH: ABO/RH(D): B POS

## 2015-03-19 MED ORDER — OXYTOCIN BOLUS FROM INFUSION: 500.0000 mL | INTRAVENOUS | Status: DC

## 2015-03-19 MED ORDER — LACTATED RINGERS IV SOLN: 500.0000 mL | INTRAVENOUS | Status: DC | PRN

## 2015-03-19 MED ORDER — ACETAMINOPHEN 325 MG PO TABS: 650.0000 mg | ORAL_TABLET | ORAL | Status: DC | PRN

## 2015-03-19 MED ORDER — BUTORPHANOL TARTRATE 1 MG/ML IJ SOLN: 1.0000 mg | INTRAMUSCULAR | Status: DC | PRN

## 2015-03-19 MED ORDER — OXYTOCIN 40 UNITS IN LACTATED RINGERS INFUSION - SIMPLE MED
1.0000 m[IU]/min | INTRAVENOUS | Status: DC
  Administered 2015-03-20: 1 m[IU]/min via INTRAVENOUS
  Filled 2015-03-19: qty 1000

## 2015-03-19 MED ORDER — OXYTOCIN 40 UNITS IN LACTATED RINGERS INFUSION - SIMPLE MED: 62.5000 mL/h | INTRAVENOUS | Status: DC

## 2015-03-19 MED ORDER — LACTATED RINGERS IV SOLN
INTRAVENOUS | Status: DC
  Administered 2015-03-20 (×2): 125 mL/h via INTRAVENOUS

## 2015-03-19 MED ORDER — CITRIC ACID-SODIUM CITRATE 334-500 MG/5ML PO SOLN: 30.0000 mL | ORAL | Status: DC | PRN

## 2015-03-19 MED ORDER — LIDOCAINE HCL (PF) 1 % IJ SOLN
30.0000 mL | INTRAMUSCULAR | Status: DC | PRN
  Filled 2015-03-19: qty 30

## 2015-03-19 MED ORDER — TERBUTALINE SULFATE 1 MG/ML IJ SOLN: 0.2500 mg | Freq: Once | INTRAMUSCULAR | Status: DC | PRN

## 2015-03-19 MED ORDER — ONDANSETRON HCL 4 MG/2ML IJ SOLN: 4.0000 mg | Freq: Four times a day (QID) | INTRAMUSCULAR | Status: DC | PRN

## 2015-03-19 NOTE — H&P (Signed)
Obstetric History and Physical  Emily Howard is a 39 y.o. [id number] with IUP at [redacted]w[redacted]d presenting for elective IOL. Patient states she has been having  irregular, every 5-10 minutes contractions, none vaginal bleeding, intact membranes, with active fetal movement.    Prenatal Course Source of Care: [facility] with onset of care at 10 weeks Pregnancy complications or risks: Patient Active Problem List   Diagnosis Date Noted  . Post-dates pregnancy 03/19/2015  . Uterine fibroid in pregnancy 12/08/2014  . Advanced maternal age in multigravida 11/10/2014  . Hypothyroidism 10/13/2014   She plans to breastfeed She is undecided for postpartum contraception.   Prenatal labs and studies: ABO, Rh: B/Positive/-- (04/08 0000) Antibody: Negative (04/08 0000) Rubella: Immune (04/08 0000) RPR: Nonreactive (04/08 0000)  HBsAg: Negative (04/08 0000)  HIV: Non-reactive (04/08 0000)  [id number] (11/27 0000) 1 hr Glucola  normal Genetic screening normal Anatomy US Normal, with large uterine fibroids  Prenatal Transfer Tool  Maternal Diabetes: No Genetic Screening: Normal Maternal Ultrasounds/Referrals: Abnormal:  Findings:   Fetal renal pyelectasis Fetal Ultrasounds or other Referrals:  Referred to Materal Fetal Medicine  Maternal Substance Abuse:  No Significant Maternal Medications:  None Significant Maternal Lab Results: None  Past Medical History  Diagnosis Date  . Thyroid disease   . H/O hemorrhoidectomy   . Neutropenia ([place])   . Insomnia   . GERD (gastroesophageal reflux disease)   . Myalgia   . Fibroid, uterine   . Hypothyroidism   . AMA (advanced maternal age) multigravida 66+   . Hypotension   . Vitamin D deficiency     Past Surgical History  Procedure Laterality Date  . Thyroidectomy  2010  . Hemorrhoid surgery      OB History  Gravida Para Term Preterm AB SAB TAB Ectopic Multiple Living  3 2 2       2     # Outcome Date GA Lbr Len/2nd Weight Sex  Delivery Anes PTL Lv  3 Current           2 Term 2007   10 lb (4.536 kg) M Vag-Spont   Y  1 Term 2005   7 lb 6 oz (3.345 kg) M Vag-Spont   Y      Social History   Social History  . Marital Status: Married    Spouse Name: N/A  . Number of Children: N/A  . Years of Education: N/A   Social History Main Topics  . Smoking status: Never Smoker   . Smokeless tobacco: Never Used  . Alcohol Use: No  . Drug Use: No  . Sexual Activity: Yes    Birth Control/ Protection: None     Comment: Pregnant   Other Topics Concern  . Not on file   Social History Narrative    Family History  Problem Relation Age of Onset  . Hypertension Mother   . Pancreatic cancer Father     Prescriptions prior to admission  Medication Sig Dispense Refill Last Dose  . docusate sodium (COLACE) 100 MG capsule Take 1 capsule (100 mg total) by mouth 2 (two) times daily. 60 capsule 2 Taking  . levothyroxine (SYNTHROID, LEVOTHROID) 175 MCG tablet Take 175 mcg by mouth daily before breakfast.   Taking  . Misc. Devices (AMEDA ELITE BREAST PUMP) MISC 1 each by Does not apply route as needed. 1 each 0 Taking  . Prenat w/o A Vit-FeFum-FePo-FA (PROVIDA OB) 20-20-1.25 MG CAPS Take 1 capsule by mouth daily.  12 Taking  Allergies  Allergen Reactions  . Astramorph [Morphine] Itching    Itching for a week  . Chloroquine Hives and Itching    Review of Systems: Negative except for what is mentioned in HPI.  Physical Exam: There were no vitals taken for this visit. CONSTITUTIONAL: Well-developed, well-nourished female in no acute distress.  HENT:  Normocephalic, atraumatic, External right and left ear normal. Oropharynx is clear and moist EYES: Conjunctivae and EOM are normal. Pupils are equal, round, and reactive to light. No scleral icterus.  NECK: Normal range of motion, supple, no masses SKIN: Skin is warm and dry. No rash noted. Not diaphoretic. No erythema. No pallor. [place]: Alert and oriented to person,  place, and time. Normal reflexes, muscle tone coordination. No cranial nerve deficit noted. PSYCHIATRIC: Normal mood and affect. Normal behavior. Normal judgment and thought content. CARDIOVASCULAR: Normal heart rate noted, regular rhythm RESPIRATORY: Effort and breath sounds normal, no problems with respiration noted ABDOMEN: Soft, nontender, nondistended, gravid,. Irregular contours, fibroids palpable. MUSCULOSKELETAL: Normal range of motion. No edema and no tenderness. 2+ distal pulses.  Cervical Exam: Dilatation 3cm   Effacement 50%   Station -3   Presentation: cephalic FHT:  Baseline rate 160 bpm   Variability moderate  Accelerations present   Decelerations none Contractions: infrequent (> 10 min)   Pertinent Labs/Studies:   Results for orders placed or performed during the hospital encounter of 03/19/15 (from the past 24 hour(s))  CBC     Status: Abnormal   Collection Time: 03/19/15  9:14 PM  Result Value Ref Range   WBC 6.6 3.6 - 11.0 K/uL   RBC 4.08 3.80 - 5.20 MIL/uL   Hemoglobin 11.3 (L) 12.0 - 16.0 g/dL   HCT 33.7 (L) 35.0 - 47.0 %   MCV 82.6 80.0 - 100.0 fL   MCH 27.6 26.0 - 34.0 pg   MCHC 33.4 32.0 - 36.0 g/dL   RDW 14.1 11.5 - 14.5 %   Platelets 125 (L) 150 - 440 K/uL  Type and screen     Status: None   Collection Time: 03/19/15  9:14 PM  Result Value Ref Range   ABO/RH(D) B POS    Antibody Screen NEG    Sample Expiration 03/22/2015   ABO/Rh     Status: None   Collection Time: 03/19/15  9:14 PM  Result Value Ref Range   ABO/RH(D) B POS     Assessment : [name] is a 39 y.o. G3P2002 at [redacted]w[redacted]d being admitted for postdates, desiring elective IOL.  Also with thrombocytopenia (likely gestational).  Plan: Labor: Active management.  Induction per protocol with Pitocin FWB: Reassuring fetal heart tracing.  GBS negative Delivery plan: Hopeful for vaginal delivery   [name], MD [facility] 03/19/2015 10:25 PM

## 2015-03-20 ENCOUNTER — Inpatient Hospital Stay: Payer: BLUE CROSS/BLUE SHIELD | Admitting: Anesthesiology

## 2015-03-20 DIAGNOSIS — Z3483 Encounter for supervision of other normal pregnancy, third trimester: Secondary | ICD-10-CM

## 2015-03-20 MED ORDER — ROPIVACAINE HCL 2 MG/ML IJ SOLN
10.0000 mL/h | INTRAMUSCULAR | Status: DC
  Administered 2015-03-20: 10 mL/h via EPIDURAL
  Filled 2015-03-20 (×2): qty 200

## 2015-03-20 MED ORDER — PRENATAL MULTIVITAMIN CH
1.0000 | ORAL_TABLET | Freq: Every day | ORAL | Status: DC
  Administered 2015-03-21 – 2015-03-22 (×2): 1 via ORAL
  Filled 2015-03-20 (×2): qty 1

## 2015-03-20 MED ORDER — MISOPROSTOL 200 MCG PO TABS: 800.0000 ug | ORAL_TABLET | Freq: Once | ORAL | Status: DC

## 2015-03-20 MED ORDER — ONDANSETRON HCL 4 MG PO TABS: 4.0000 mg | ORAL_TABLET | ORAL | Status: DC | PRN

## 2015-03-20 MED ORDER — IBUPROFEN 600 MG PO TABS
600.0000 mg | ORAL_TABLET | Freq: Four times a day (QID) | ORAL | Status: DC
  Administered 2015-03-20 – 2015-03-22 (×7): 600 mg via ORAL
  Filled 2015-03-20 (×6): qty 1

## 2015-03-20 MED ORDER — ZOLPIDEM TARTRATE 5 MG PO TABS: 5.0000 mg | ORAL_TABLET | Freq: Every evening | ORAL | Status: DC | PRN

## 2015-03-20 MED ORDER — ONDANSETRON HCL 4 MG/2ML IJ SOLN: 4.0000 mg | INTRAMUSCULAR | Status: DC | PRN

## 2015-03-20 MED ORDER — DIBUCAINE 1 % RE OINT
1.0000 "application " | TOPICAL_OINTMENT | RECTAL | Status: DC | PRN
  Filled 2015-03-20: qty 28

## 2015-03-20 MED ORDER — METHYLERGONOVINE MALEATE 0.2 MG/ML IJ SOLN
0.2000 mg | INTRAMUSCULAR | Status: DC | PRN
  Filled 2015-03-20: qty 1

## 2015-03-20 MED ORDER — MISOPROSTOL 200 MCG PO TABS
ORAL_TABLET | ORAL | Status: AC
  Filled 2015-03-20: qty 4

## 2015-03-20 MED ORDER — OXYTOCIN 10 UNIT/ML IJ SOLN
INTRAMUSCULAR | Status: AC
  Filled 2015-03-20: qty 2

## 2015-03-20 MED ORDER — METHYLERGONOVINE MALEATE 0.2 MG/ML IJ SOLN: 0.2000 mg | Freq: Once | INTRAMUSCULAR | Status: DC

## 2015-03-20 MED ORDER — BUPIVACAINE HCL (PF) 0.25 % IJ SOLN
INTRAMUSCULAR | Status: DC | PRN
  Administered 2015-03-20: 10 mL via EPIDURAL

## 2015-03-20 MED ORDER — OXYCODONE-ACETAMINOPHEN 5-325 MG PO TABS
1.0000 | ORAL_TABLET | ORAL | Status: DC | PRN
  Administered 2015-03-21 – 2015-03-22 (×2): 1 via ORAL
  Filled 2015-03-20 (×2): qty 1

## 2015-03-20 MED ORDER — LOPERAMIDE HCL 2 MG PO CAPS
4.0000 mg | ORAL_CAPSULE | Freq: Once | ORAL | Status: AC
  Administered 2015-03-20: 4 mg via ORAL
  Filled 2015-03-20: qty 2

## 2015-03-20 MED ORDER — AMMONIA AROMATIC IN INHA
RESPIRATORY_TRACT | Status: AC
  Filled 2015-03-20: qty 10

## 2015-03-20 MED ORDER — DIPHENHYDRAMINE HCL 25 MG PO CAPS: 25.0000 mg | ORAL_CAPSULE | Freq: Four times a day (QID) | ORAL | Status: DC | PRN

## 2015-03-20 MED ORDER — CARBOPROST TROMETHAMINE 250 MCG/ML IM SOLN
INTRAMUSCULAR | Status: AC
  Administered 2015-03-20: 250 ug
  Filled 2015-03-20: qty 1

## 2015-03-20 MED ORDER — SENNOSIDES-DOCUSATE SODIUM 8.6-50 MG PO TABS
2.0000 | ORAL_TABLET | ORAL | Status: DC
  Filled 2015-03-20 (×2): qty 2

## 2015-03-20 MED ORDER — METHYLERGONOVINE MALEATE 0.2 MG PO TABS: 0.2000 mg | ORAL_TABLET | ORAL | Status: DC | PRN

## 2015-03-20 MED ORDER — SIMETHICONE 80 MG PO CHEW: 80.0000 mg | CHEWABLE_TABLET | ORAL | Status: DC | PRN

## 2015-03-20 MED ORDER — LANOLIN HYDROUS EX OINT: TOPICAL_OINTMENT | CUTANEOUS | Status: DC | PRN

## 2015-03-20 MED ORDER — LEVOTHYROXINE SODIUM 75 MCG PO TABS
175.0000 ug | ORAL_TABLET | Freq: Every day | ORAL | Status: DC
  Administered 2015-03-22: 175 ug via ORAL
  Filled 2015-03-20 (×2): qty 1

## 2015-03-20 MED ORDER — IBUPROFEN 600 MG PO TABS
ORAL_TABLET | ORAL | Status: AC
Start: 2015-03-20 — End: 2015-03-20
  Administered 2015-03-20: 600 mg via ORAL
  Filled 2015-03-20: qty 1

## 2015-03-20 MED ORDER — BENZOCAINE-MENTHOL 20-0.5 % EX AERO: 1.0000 "application " | INHALATION_SPRAY | CUTANEOUS | Status: DC | PRN

## 2015-03-20 MED ORDER — WITCH HAZEL-GLYCERIN EX PADS: 1.0000 "application " | MEDICATED_PAD | CUTANEOUS | Status: DC | PRN

## 2015-03-20 MED ORDER — LIDOCAINE-EPINEPHRINE (PF) 1.5 %-1:200000 IJ SOLN
INTRAMUSCULAR | Status: DC | PRN
  Administered 2015-03-20: 3 mL via PERINEURAL

## 2015-03-20 MED ORDER — ACETAMINOPHEN 325 MG PO TABS: 650.0000 mg | ORAL_TABLET | ORAL | Status: DC | PRN

## 2015-03-20 MED ORDER — FENTANYL 2.5 MCG/ML W/ROPIVACAINE 0.2% IN NS 100 ML EPIDURAL INFUSION (ARMC-ANES)
EPIDURAL | Status: AC
  Filled 2015-03-20: qty 100

## 2015-03-20 NOTE — Progress Notes (Signed)
Intrapartum Progress Note  S: Patient s/p epidural, no complaints.  O: Blood pressure 91/58, pulse 78, temperature 98.1 F (36.7 C), temperature source Oral, resp. rate 16, height 5\' 5"  (1.651 m), weight 192 lb (87.091 kg), SpO2 100 %. Gen App: NAD, comfortable Abdomen: soft, gravid FHT: baseline 130 bpm.  Accels present.  Decels absent. moderate in degree variability.   Tocometer: contractions q 2-3 minutes Cervix: 4/50/c/-3/SROM with blood tinged fluid Extremities: Nontender, no edema.  Pitocin: 8 mIU  Labs:  No new labs   Assessment:  1: SIUP at [redacted]w[redacted]d 2. Fibroid uterus 3. AROM  Plan:  1. Continue active management of labor. Anticipate vaginal delivery 2. FSE placed.    [name], MD 03/20/2015 11:39 AM

## 2015-03-20 NOTE — Progress Notes (Signed)
Discussed holding the induction until some deliveries are done.  She was in agreement.  Discussed POC with patient and she was fine with it.   Assisted patient up to bathroom to void.  I prepared a recliner with sheets and blanket so patient could sit up for awhile.

## 2015-03-20 NOTE — Progress Notes (Signed)
Pt assisted to bathroom to void.  EFM readjusted.  Pitocin increased.

## 2015-03-20 NOTE — Anesthesia Preprocedure Evaluation (Addendum)
Anesthesia Evaluation  Patient identified by MRN, date of birth, ID band Patient awake    Reviewed: Allergy & Precautions, NPO status , Patient's Chart, lab work & pertinent test results  Airway Mallampati: II  TM Distance: >3 FB     Dental no notable dental hx.    Pulmonary neg pulmonary ROS,    Pulmonary exam normal breath sounds clear to auscultation       Cardiovascular negative cardio ROS Normal cardiovascular exam     Neuro/Psych negative neurological ROS  negative psych ROS   GI/Hepatic Neg liver ROS, GERD  Medicated and Controlled,  Endo/Other  Hypothyroidism   Renal/GU negative Renal ROS  negative genitourinary   Musculoskeletal myalgias   Abdominal Normal abdominal exam  (+)   Peds negative pediatric ROS (+)  Hematology negative hematology ROS (+)   Anesthesia Other Findings   Reproductive/Obstetrics (+) Pregnancy                             Anesthesia Physical Anesthesia Plan  ASA: II  Anesthesia Plan: Epidural   Post-op Pain Management:    Induction:   Airway Management Planned: Natural Airway  Additional Equipment:   Intra-op Plan:   Post-operative Plan:   Informed Consent: I have reviewed the patients History and Physical, chart, labs and discussed the procedure including the risks, benefits and alternatives for the proposed anesthesia with the patient or authorized representative who has indicated his/her understanding and acceptance.   Dental advisory given  Plan Discussed with: Surgeon  Anesthesia Plan Comments:         Anesthesia Quick Evaluation

## 2015-03-20 NOTE — Progress Notes (Signed)
Pt back to bed.  IV fluids hung and Pitocin started at 57mu/min.  EFM adjusted, room darkened for sleep.

## 2015-03-20 NOTE — Plan of Care (Signed)
Problem: Activity: Goal: Will verbalize the importance of balancing activity with adequate rest periods Outcome: Progressing Ambulating with bed side assist. Tolerating well.  Problem: Education: Goal: Knowledge of condition will improve Outcome: Progressing Third Time Mom with Breast Feeding Experience. Has Voided and stooled. Has Practiced Skin to Skin. Demonstrates knowledge of Peri Care.   Problem: Life Cycle: Goal: Risk for postpartum hemorrhage will decrease Outcome: Progressing PP Bleed in L&D. Pt. Has large Fibroids. Her Flow is light and Fundus is Firm at this time.  Problem: Nutritional: Goal: Dietary intake will improve Outcome: Progressing Tolerating Reg. Diet.

## 2015-03-20 NOTE — Progress Notes (Signed)
Intrapartum Progress Note  S: Patient beginning to feel contractions.   O: Blood pressure 109/59, pulse 68, temperature 98.4 F (36.9 C), temperature source Oral, resp. rate 18, height 5\' 5"  (1.651 m), weight 192 lb (87.091 kg), SpO2 100 %. Gen App: NAD, comfortable Abdomen: soft, gravid FHT: baseline 135 bpm.  Accels present.  Decels absent. moderate in degree variability.   Tocometer: contractions q 4-6 minutes Cervix: 3/50/-3 (very posterior, difficult to reach) Extremities: Nontender, no edema.  Pitocin: at 6 mIU  Labs:  No new labs   Assessment:  1: SIUP at [redacted]w[redacted]d 2. Fibroid uterus  Plan:  1. Patient desires epidural. 2. Will attempt to AROM after epidural placement due to discomfort during current attempt.    [name], MD 03/20/2015 10:22 AM

## 2015-03-20 NOTE — Anesthesia Procedure Notes (Signed)
Epidural Patient location during procedure: OB Start time: 03/20/2015 10:35 AM End time: 03/20/2015 10:45 AM  Staffing Anesthesiologist: Alvin Critchley Performed by: anesthesiologist   Preanesthetic Checklist Completed: patient identified, site marked, surgical consent, pre-op evaluation, timeout performed, IV checked, risks and benefits discussed and monitors and equipment checked  Epidural Patient position: sitting Prep: Betadine and site prepped and draped Patient monitoring: heart rate, cardiac monitor, continuous pulse ox and blood pressure Approach: midline Location: L3-L4 Injection technique: LOR air  Needle:  Needle type: Tuohy  Needle gauge: 18 G Catheter type: closed end Catheter size: 20 Guage Test dose: negative and 1.5% lidocaine with Epi 1:200 K  Assessment Sensory level: T8  Additional Notes Time out called.  Patient placed in sitting position and prepped and draped in sterile fashion.  A skin wheal was made at the L3-L4 interspace with 1% lidocaine plain.  The Epidural was placed as above X 1 with no complications.  The epidural catheter was threaded 3 cm with a negative TD.  The patient tolreated the procedure well.Reason for block:procedure for pain

## 2015-03-21 DIAGNOSIS — O99119 Other diseases of the blood and blood-forming organs and certain disorders involving the immune mechanism complicating pregnancy, unspecified trimester: Secondary | ICD-10-CM

## 2015-03-21 DIAGNOSIS — D696 Thrombocytopenia, unspecified: Secondary | ICD-10-CM | POA: Diagnosis present

## 2015-03-21 LAB — CBC
HCT: 32.5 % — ABNORMAL LOW (ref 35.0–47.0)
Hemoglobin: 10.9 g/dL — ABNORMAL LOW (ref 12.0–16.0)
MCH: 27.7 pg (ref 26.0–34.0)
MCHC: 33.6 g/dL (ref 32.0–36.0)
MCV: 82.7 fL (ref 80.0–100.0)
PLATELETS: 113 10*3/uL — AB (ref 150–440)
RBC: 3.93 MIL/uL (ref 3.80–5.20)
RDW: 13.8 % (ref 11.5–14.5)
WBC: 14 10*3/uL — ABNORMAL HIGH (ref 3.6–11.0)

## 2015-03-21 LAB — RPR: RPR Ser Ql: NONREACTIVE

## 2015-03-21 MED ORDER — AMMONIA AROMATIC IN INHA
RESPIRATORY_TRACT | Status: AC
  Filled 2015-03-21: qty 20

## 2015-03-21 MED ORDER — OXYTOCIN 40 UNITS IN LACTATED RINGERS INFUSION - SIMPLE MED
62.5000 mL/h | INTRAVENOUS | Status: DC
  Administered 2015-03-21: 62.5 mL/h via INTRAVENOUS
  Filled 2015-03-21: qty 1000

## 2015-03-21 NOTE — Progress Notes (Signed)
Pt. Ambulated to bathroom and tolerated well after innitial attempt earlier in shift at which time, Mom had C/O feeling dizzy. Mom tolerated well and denied any C/O. Pt. Stated she, "Feels Better."

## 2015-03-21 NOTE — Anesthesia Postprocedure Evaluation (Signed)
Anesthesia Post Note  Patient: Emily Howard  Procedure(s) Performed: * No procedures listed *  Patient location during evaluation: Mother Baby Anesthesia Type: Epidural Level of consciousness: awake and alert and oriented Pain management: pain level controlled Vital Signs Assessment: post-procedure vital signs reviewed and stable Respiratory status: spontaneous breathing Cardiovascular status: stable Postop Assessment: no headache Anesthetic complications: no    Last Vitals:  Filed Vitals:   03/21/15 0034 03/21/15 0349  BP: 115/59 108/53  Pulse: 59 66  Temp: 36.8 C 36.6 C  Resp: 20 20    Last Pain:  Filed Vitals:   03/21/15 0621  PainSc: 0-No pain                 Mekaylah Klich,  Clearnce Sorrel

## 2015-03-21 NOTE — Progress Notes (Addendum)
Pt. Assisted to Bathroom and voided 400cc urine. While Pt. Was washing her hands, she C/O feeling dizzy.Pt. Was assisted to floor by Rodena Medin so that Pt. Could sit down. Pt. Was alert and oriented and stated that she no longer felt dizzy once she sat down. Pt. Assisted to wheelchair anf then to bed. VSS. Alert and oriented and denies c/o. Physical assessment unchanged. Lochial flow was moderate when Pt. Voided. Lochial Flow was small 15 minutes later. Fundus is firm and mid-line.

## 2015-03-21 NOTE — Progress Notes (Addendum)
Post Partum Day #1 s/p SVD  Subjective: no complaints, up ad lib, voiding and tolerating PO.  Patient does report dizzy episode overnight with near-syncopal episode when ambulating to the restroom.  Patient thinks that it was because she was tired.   Objective: Temp:  [97.9 F (36.6 C)-99 F (37.2 C)] 98.4 F (36.9 C) (12/05 0815) Pulse Rate:  [59-160] 64 (12/05 0815) Resp:  [16-20] 18 (12/05 0815) BP: (74-119)/(46-76) 104/53 mmHg (12/05 0815) SpO2:  [99 %-100 %] 100 % (12/05 0815)  Physical Exam:  General: alert and no distress Lochia: appropriate Uterine Fundus: firm, irregular contour, large palpable anterior fibroid present, ~ 8 cm.  Incision: None DVT Evaluation: No evidence of DVT seen on physical exam. Negative Homan's sign. No cords or calf tenderness. No significant calf/ankle edema.  CBC Latest Ref Rng 03/21/2015 03/19/2015  WBC 3.6 - 11.0 K/uL 14.0(H) 6.6  Hemoglobin 12.0 - 16.0 g/dL 10.9(L) 11.3(L)  Hematocrit 35.0 - 47.0 % 32.5(L) 33.7(L)  Platelets 150 - 440 K/uL 113(L) 125(L)     Assessment/Plan: Plan for discharge tomorrow, Breastfeeding and Contraception undecided  Postpartum hemorrhage - treated with Hemabate (x 1 dose) and Cytotec immediately after delivery.  H/H stable. Vitals stable.  Continue to monitor symptoms  (dizziness).  Currently asymptomatic with only mild anemia noted. Can be treated with PO iron supplementation within PNV. Thrombocytopenia - mild, likely gestational.  Will continue to monitor postpartum. No intervention required at this time.  Hypothyroidism - patient has resumed thyroid medication. Will decrease dose based on postpartum status.     LOS: 2 days   [name] 03/21/2015, 11:17 AM

## 2015-03-21 NOTE — Discharge Instructions (Signed)

## 2015-03-22 ENCOUNTER — Encounter: Payer: Self-pay | Admitting: Emergency Medicine

## 2015-03-22 MED ORDER — IBUPROFEN 800 MG PO TABS: 800.0000 mg | ORAL_TABLET | Freq: Three times a day (TID) | ORAL | Status: DC | PRN

## 2015-03-22 MED ORDER — LEVOTHYROXINE SODIUM 125 MCG PO TABS: 125.0000 ug | ORAL_TABLET | Freq: Every day | ORAL | Status: DC

## 2015-03-22 NOTE — Progress Notes (Signed)
Post Partum Day #2 s/p SVD  Subjective: no complaints, up ad lib, voiding and tolerating PO.     Objective: Temp:  [97.6 F (36.4 C)-98.6 F (37 C)] 98.6 F (37 C) (12/06 0818) Pulse Rate:  [68-75] 75 (12/06 0818) Resp:  [16-20] 20 (12/06 0818) BP: (97-112)/(50-59) 112/59 mmHg (12/06 0818) SpO2:  [98 %-100 %] 98 % (12/06 0818)  Physical Exam:  General: alert and no distress Lochia: appropriate Uterine Fundus: firm, irregular contour, large palpable anterior fibroid present, ~ 8 cm.  Incision: None DVT Evaluation: No evidence of DVT seen on physical exam. Negative Homan's sign. No cords or calf tenderness. No significant calf/ankle edema.  CBC Latest Ref Rng 03/21/2015 03/19/2015  WBC 3.6 - 11.0 K/uL 14.0(H) 6.6  Hemoglobin 12.0 - 16.0 g/dL 10.9(L) 11.3(L)  Hematocrit 35.0 - 47.0 % 32.5(L) 33.7(L)  Platelets 150 - 440 K/uL 113(L) 125(L)     Assessment/Plan: Discharge home, Breastfeeding and Contraception undecided. To discuss further at posptartum visit.  Postpartum hemorrhage - treated with Hemabate (x 1 dose) and Cytotec immediately after delivery.  H/H stable. Vitals stable.  No further symptoms.   Can treat mild anemia with PO iron supplementation within PNV. Thrombocytopenia - mild, likely gestational.  Will continue to monitor postpartum. No intervention required at this time.  Hypothyroidism - patient has resumed thyroid medication. Will decrease dose based on postpartum status.     LOS: 3 days   [name] 03/22/2015, 12:46 PM

## 2015-03-22 NOTE — Discharge Summary (Signed)
Obstetric Discharge Summary Reason for Admission: induction of labor Prenatal Procedures: ultrasound Intrapartum Procedures: spontaneous vaginal delivery Postpartum Procedures: none Complications-Operative and Postpartum: hemorrhage HEMOGLOBIN  Date Value Ref Range Status  03/21/2015 10.9* 12.0 - 16.0 g/dL Final  07/23/2014 13.1 g/dL Final   HCT  Date Value Ref Range Status  03/21/2015 32.5* 35.0 - 47.0 % Final  07/23/2014 39 % Final   HEMATOCRIT  Date Value Ref Range Status  12/08/2014 32.5* 34.0 - 46.6 % Final    Physical Exam:  General: alert and no distress Lochia: appropriate Uterine Fundus: firm Incision: None DVT Evaluation: No evidence of DVT seen on physical exam. Negative Homan's sign. No cords or calf tenderness.  Discharge Diagnoses: Term Pregnancy-delivered, Post-date pregnancy and fibroid uterus, and postpartum hemorrhage  Discharge Information: Date: 03/22/2015 Activity: pelvic rest Diet: routine Medications: PNV and Ibuprofen Condition: stable Instructions: refer to practice specific booklet Discharge to: home Follow-up Information    Follow up with [name], MD In 6 weeks.   Specialties:  Obstetrics and Gynecology, Radiology   Why:  Postpartum visit   Contact information:   [place] [id number] [phone]       Newborn Data: Live born female  Birth Weight: 7 lb 12.3 oz (3525 g) APGAR: 8, 9  Home with mother.  [name] 03/22/2015, 12:53 PM

## 2015-03-22 NOTE — Progress Notes (Signed)
Patient understands all discharge instructions and the need to make follow up appointments. Patient discharge via wheelchair with auxillary. 

## 2015-05-05 ENCOUNTER — Encounter: Payer: Self-pay | Admitting: Obstetrics and Gynecology

## 2015-05-05 ENCOUNTER — Ambulatory Visit (INDEPENDENT_AMBULATORY_CARE_PROVIDER_SITE_OTHER): Payer: BLUE CROSS/BLUE SHIELD | Admitting: Obstetrics and Gynecology

## 2015-05-05 VITALS — BP 130/82 | HR 72 | Wt 175.3 lb

## 2015-05-05 DIAGNOSIS — IMO0001 Reserved for inherently not codable concepts without codable children: Secondary | ICD-10-CM

## 2015-05-05 DIAGNOSIS — R030 Elevated blood-pressure reading, without diagnosis of hypertension: Secondary | ICD-10-CM

## 2015-05-05 DIAGNOSIS — Z3009 Encounter for other general counseling and advice on contraception: Secondary | ICD-10-CM

## 2015-05-06 LAB — COMPREHENSIVE METABOLIC PANEL
A/G RATIO: 1.6 (ref 1.1–2.5)
ALK PHOS: 50 IU/L (ref 39–117)
ALT: 16 IU/L (ref 0–32)
AST: 20 IU/L (ref 0–40)
Albumin: 4.2 g/dL (ref 3.5–5.5)
BUN/Creatinine Ratio: 16 (ref 9–23)
BUN: 10 mg/dL (ref 6–24)
CHLORIDE: 102 mmol/L (ref 96–106)
CO2: 27 mmol/L (ref 18–29)
Calcium: 9 mg/dL (ref 8.7–10.2)
Creatinine, Ser: 0.61 mg/dL (ref 0.57–1.00)
GFR calc non Af Amer: 114 mL/min/{1.73_m2} (ref >59)
GFR, EST AFRICAN AMERICAN: 131 mL/min/{1.73_m2} (ref >59)
GLUCOSE: 67 mg/dL (ref 65–99)
Globulin, Total: 2.6 g/dL (ref 1.5–4.5)
POTASSIUM: 4.2 mmol/L (ref 3.5–5.2)
Sodium: 142 mmol/L (ref 134–144)
TOTAL PROTEIN: 6.8 g/dL (ref 6.0–8.5)

## 2015-05-06 LAB — CBC
Hematocrit: 34.6 % (ref 34.0–46.6)
Hemoglobin: 12.1 g/dL (ref 11.1–15.9)
MCH: 26.8 pg (ref 26.6–33.0)
MCHC: 35 g/dL (ref 31.5–35.7)
MCV: 77 fL — AB (ref 79–97)
PLATELETS: 233 10*3/uL (ref 150–379)
RBC: 4.52 x10E6/uL (ref 3.77–5.28)
RDW: 13.3 % (ref 12.3–15.4)
WBC: 4.5 10*3/uL (ref 3.4–10.8)

## 2015-05-06 LAB — PROTEIN / CREATININE RATIO, URINE
CREATININE, UR: 54.2 mg/dL
PROTEIN UR: 7 mg/dL
PROTEIN/CREAT RATIO: 129 mg/g{creat} (ref 0–200)

## 2015-05-06 LAB — URIC ACID: URIC ACID: 4.7 mg/dL (ref 2.5–7.1)

## 2015-05-07 ENCOUNTER — Encounter: Payer: Self-pay | Admitting: Obstetrics and Gynecology

## 2015-05-07 NOTE — Progress Notes (Addendum)
   OBSTETRIC POSTPARTUM CLINIC PROGRESS NOTE  Subjective:     Emily Howard is a 40 y.o. G19P3003 female who presents for a postpartum visit. She is 6 weeks postpartum following a spontaneous vaginal delivery. I have fully reviewed the prenatal and intrapartum course. The delivery was at 40.5 gestational weeks. Pregnancy was complicated by large fibroid uterus, hypothyroidism, and advanced maternal age. Anesthesia: epidural. Postpartum course has been well. Baby's course has been well. Baby is feeding by breast. Bleeding staining only and unsure if resumption of menses last week.. Bowel function is normal. Bladder function is normal. Patient is not sexually active. Contraception method is IUD. Postpartum depression screening: negative.  The following portions of the patient's history were reviewed and updated as appropriate: allergies, current medications, past family history, past medical history, past social history, past surgical history and problem list.  Review of Systems A comprehensive review of systems was negative except for: Neurological: positive for headaches   Objective:    BP 130/82 mmHg  Pulse 72  Wt 175 lb 4.8 oz (79.516 kg)  LMP 04/28/2015  Breastfeeding? Yes  General:  alert and no distress   Breasts:  inspection negative, no nipple discharge or bleeding, no masses or nodularity palpable  Lungs: clear to auscultation bilaterally  Heart:  regular rate and rhythm, S1, S2 normal, no murmur, click, rub or gallop  Abdomen: soft, non-tender; bowel sounds normal; no masses,  no organomegaly   Vulva:  normal  Vagina: normal vagina, no discharge, exudate, lesion, or erythema  Cervix:  multiparous appearance, no cervical motion tenderness and no lesions  Corpus: enlarged, 12-14 weeks size  Adnexa:  no mass, fullness, tenderness  Rectal Exam: Not performed.          Labs:  Lab Results  Component Value Date   HGB 10.9* 03/21/2015    Assessment:   Routine postpartum  exam. Pap smear not done at today's visit.   Fibroid uterus Elevated BP H/o hypothyroidism  Plan:   1. Contraception: Mirena IUD (after further discussion of other options and concern for decreased libido with use of last IUD, patient still desires to proceed with another Mirena).  2. Fibroid uterus - currently asymptomatic.  Advised that intervention not required unless symptomatic.  3. Elevated BP (patient without h/o HTN prior to or during pregnancy) and recent headaches.  Will order [facility] labs to r/o postpartum pre-eclampsia. Patient to f/u in 1 week for IUD, will also recheck BP at that time if labs negative.  4. Patient has f/u with Endocrinologist regarding h/o hypothyroidism 5. Follow up in: 1 weeks for IUD placement.      [name], MD [facility]

## 2015-05-18 ENCOUNTER — Encounter: Payer: Self-pay | Admitting: Obstetrics and Gynecology

## 2015-05-18 ENCOUNTER — Ambulatory Visit (INDEPENDENT_AMBULATORY_CARE_PROVIDER_SITE_OTHER): Payer: BLUE CROSS/BLUE SHIELD | Admitting: Obstetrics and Gynecology

## 2015-05-18 VITALS — BP 116/72 | HR 71 | Wt 174.9 lb

## 2015-05-18 DIAGNOSIS — Z30430 Encounter for insertion of intrauterine contraceptive device: Secondary | ICD-10-CM | POA: Diagnosis not present

## 2015-05-18 LAB — POCT URINE PREGNANCY: Preg Test, Ur: NEGATIVE

## 2015-05-18 MED ORDER — LEVONORGESTREL 20 MCG/24HR IU IUD: 1.0000 | INTRAUTERINE_SYSTEM | Freq: Once | INTRAUTERINE | Status: AC

## 2015-05-18 NOTE — Progress Notes (Addendum)
    GYNECOLOGY CLINIC PROCEDURE NOTE  Emily Howard is a 40 y.o. [id number] here for Mirena IUD insertion. No GYN concerns.  Last pap smear was in 2015 and was normal.  IUD Insertion Procedure Note Patient identified, informed consent performed, consent signed.   Discussed risks of irregular bleeding, cramping, infection, malpositioning or misplacement of the IUD outside the uterus which may require further procedure such as laparoscopy. Time out was performed.  Urine pregnancy test negative.  Speculum placed in the vagina.  Cervix visualized.  Cleaned with Betadine x 2.  Grasped anteriorly with a single tooth tenaculum.  Uterus sounded to 9 cm.  Mirena IUD placed per manufacturer's recommendations.  Strings trimmed to 3 cm. Tenaculum was removed, good hemostasis noted.  Patient tolerated procedure well.   Patient was given post-procedure instructions.  She was advised to have backup contraception for one week.  Patient was also asked to check IUD strings periodically and follow up in 4 weeks for IUD check.   Lot: [id number] Exp: 05/19  [name], MD [facility]

## 2015-05-18 NOTE — Addendum Note (Signed)
Addended by: Augusto Gamble on: 05/18/2015 10:32 PM   Modules accepted: Orders

## 2015-06-15 ENCOUNTER — Ambulatory Visit: Payer: BLUE CROSS/BLUE SHIELD | Admitting: Obstetrics and Gynecology

## 2015-07-06 ENCOUNTER — Encounter: Payer: Self-pay | Admitting: Obstetrics and Gynecology

## 2015-07-06 ENCOUNTER — Ambulatory Visit (INDEPENDENT_AMBULATORY_CARE_PROVIDER_SITE_OTHER): Payer: BLUE CROSS/BLUE SHIELD | Admitting: Obstetrics and Gynecology

## 2015-07-06 VITALS — BP 123/78 | HR 80 | Wt 170.5 lb

## 2015-07-06 DIAGNOSIS — Z30431 Encounter for routine checking of intrauterine contraceptive device: Secondary | ICD-10-CM

## 2015-07-06 NOTE — Progress Notes (Signed)
    GYNECOLOGY CLINIC PROGRESS NOTE  History:  40 y.o. [id number] here today for today for IUD string check; Mirena IUD was placed 6 weeks ago (05/18/2015). Complains that partner can feel the strings.  Notes 2 menstrual cycles last month. No concerning side effects.  The following portions of the patient's history were reviewed and updated as appropriate: allergies, current medications, past family history, past medical history, past social history, past surgical history and problem list. Last pap smear in 2015 was normal, negative HRHPV.  Review of Systems:  Pertinent items are noted in HPI.  Objective:  Physical Exam Blood pressure 123/78, pulse 80, weight 170 lb 8 oz (77.338 kg), currently breastfeeding. CONSTITUTIONAL: Well-developed, well-nourished female in no acute distress.  ABDOMEN: Soft, no distention noted.   PELVIC: Normal appearing external genitalia; normal appearing vaginal mucosa and cervix.  IUD strings visualized, about 4 cm in length outside cervix.   Assessment & Plan:  Normal IUD check.  Strings cut down by 1 cm.   Reiterated possible abnormal bleeding within first 3 months of IUD insertion.  Patient to keep IUD in place for five years; can come in for removal if she desires pregnancy within the next five years.   Routine preventative health maintenance measures emphasized.   [name], MD [facility]

## 2015-11-09 ENCOUNTER — Encounter: Payer: BLUE CROSS/BLUE SHIELD | Admitting: Obstetrics and Gynecology

## 2015-12-21 ENCOUNTER — Encounter: Payer: Self-pay | Admitting: Obstetrics and Gynecology

## 2015-12-21 ENCOUNTER — Ambulatory Visit (INDEPENDENT_AMBULATORY_CARE_PROVIDER_SITE_OTHER): Payer: BLUE CROSS/BLUE SHIELD | Admitting: Obstetrics and Gynecology

## 2015-12-21 VITALS — BP 124/82 | HR 93 | Ht 65.0 in | Wt 169.7 lb

## 2015-12-21 DIAGNOSIS — Z1239 Encounter for other screening for malignant neoplasm of breast: Secondary | ICD-10-CM | POA: Diagnosis not present

## 2015-12-21 DIAGNOSIS — E663 Overweight: Secondary | ICD-10-CM

## 2015-12-21 DIAGNOSIS — E034 Atrophy of thyroid (acquired): Secondary | ICD-10-CM | POA: Diagnosis not present

## 2015-12-21 DIAGNOSIS — D259 Leiomyoma of uterus, unspecified: Secondary | ICD-10-CM

## 2015-12-21 DIAGNOSIS — Z131 Encounter for screening for diabetes mellitus: Secondary | ICD-10-CM | POA: Diagnosis not present

## 2015-12-21 DIAGNOSIS — Z13220 Encounter for screening for lipoid disorders: Secondary | ICD-10-CM

## 2015-12-21 DIAGNOSIS — E038 Other specified hypothyroidism: Secondary | ICD-10-CM

## 2015-12-21 DIAGNOSIS — Z01419 Encounter for gynecological examination (general) (routine) without abnormal findings: Secondary | ICD-10-CM | POA: Diagnosis not present

## 2015-12-21 DIAGNOSIS — Z124 Encounter for screening for malignant neoplasm of cervix: Secondary | ICD-10-CM

## 2015-12-21 NOTE — Progress Notes (Signed)
GYNECOLOGY ANNUAL PHYSICAL EXAM PROGRESS NOTE  Subjective:    Emily Howard is a 40 y.o. G39P3003 female who presents for an annual exam. The patient has no complaints today. The patient is sexually active.  The patient wears seatbelts: yes. The patient participates in regular exercise: yes (3-4 x weekly). Has the patient ever been transfused or tattooed?: no. The patient reports that there is not domestic violence in her life.    Gynecologic History Menarche age: 78 Patient's last menstrual period was 12/13/2015. Contraception: Mirena IUD History of STI's:  Denies  Last Pap: 2014. Results were: normal.  Denies h/o abnormal pap smears. Last mammogram: never had a mammogram.   Obstetric History   G3   P3   T3   P0   A0   L3    SAB0   TAB0   Ectopic0   Multiple0   Live Births3     # Outcome Date GA Lbr Len/2nd Weight Sex Delivery Anes PTL Lv  3 Term 03/20/15 105w5d  7 lb 12.3 oz (3.525 kg) F Vag-Spont EPI N LIV     Complications: Postpartum hemorrhage     Apgar1:  8                Apgar5: 9  2 Term 2007   10 lb (4.536 kg) M Vag-Spont   LIV  1 Term 2005   7 lb 6 oz (3.345 kg) M Vag-Spont   LIV    Obstetric Comments  Fibroid uterus in pregnancy    Past Medical History:  Diagnosis Date  . AMA (advanced maternal age) multigravida 37+   . Fibroid, uterine   . GERD (gastroesophageal reflux disease)   . H/O hemorrhoidectomy   . Hypotension   . Hypothyroidism   . Insomnia   . Myalgia   . Neutropenia ([place])   . Thyroid disease   . Vitamin D deficiency     Past Surgical History:  Procedure Laterality Date  . HEMORRHOID SURGERY    . THYROIDECTOMY  2010    Family History  Problem Relation Age of Onset  . Hypertension Mother   . Pancreatic cancer Father     Social History   Social History  . Marital status: Married    Spouse name: N/A  . Number of children: N/A  . Years of education: N/A   Occupational History  . Not on file.   Social History Main Topics    . Smoking status: Never Smoker  . Smokeless tobacco: Never Used  . Alcohol use No  . Drug use: No  . Sexual activity: Yes    Birth control/ protection: IUD     Comment: Mirena    Other Topics Concern  . Not on file   Social History Narrative  . No narrative on file    Current Outpatient Prescriptions on File Prior to Visit  Medication Sig Dispense Refill  . levonorgestrel (MIRENA, 52 MG,) 20 MCG/24HR IUD 1 Intra Uterine Device (1 each total) by Intrauterine route once. 1 each 0  . levothyroxine (SYNTHROID) 125 MCG tablet Take 1 tablet (125 mcg total) by mouth daily before breakfast. 60 tablet 2   No current facility-administered medications on file prior to visit.     Allergies  Allergen Reactions  . Astramorph [Morphine] Itching    Itching for a week  . Chloroquine Hives and Itching      Review of Systems Constitutional: negative for chills, fatigue, fevers and sweats Eyes: negative for irritation, redness  and visual disturbance Ears, nose, mouth, throat, and face: negative for hearing loss, nasal congestion, snoring and tinnitus Respiratory: negative for asthma, cough, sputum Cardiovascular: negative for chest pain, dyspnea, exertional chest pressure/discomfort, irregular heart beat, palpitations and syncope Gastrointestinal: negative for abdominal pain, change in bowel habits, nausea and vomiting Genitourinary: negative for abnormal menstrual periods, genital lesions, sexual problems and vaginal discharge, dysuria and urinary incontinence Integument/breast: negative for breast lump, breast tenderness and nipple discharge Hematologic/lymphatic: negative for bleeding and easy bruising Musculoskeletal:negative for back pain and muscle weakness Neurological: negative for dizziness, headaches, vertigo and weakness Endocrine: negative for diabetic symptoms including polydipsia, polyuria and skin dryness Allergic/Immunologic: negative for hay fever and urticaria        Objective:  Blood pressure 124/82, pulse 93, height 5\' 5"  (1.651 m), weight 169 lb 11.2 oz (77 kg), last menstrual period 12/13/2015, not currently breastfeeding. Body mass index is 28.24 kg/m.   General Appearance:    Alert, cooperative, no distress, appears stated age, overweight  Head:    Normocephalic, without obvious abnormality, atraumatic  Eyes:    PERRL, conjunctiva/corneas clear, EOM's intact, both eyes  Ears:    Normal external ear canals, both ears  Nose:   Nares normal, septum midline, mucosa normal, no drainage or sinus tenderness  Throat:   Lips, mucosa, and tongue normal; teeth and gums normal  Neck:   Supple, symmetrical, trachea midline, no adenopathy; thyroid: no enlargement/tenderness/nodules; no carotid bruit or JVD  Back:     Symmetric, no curvature, ROM normal, no CVA tenderness  Lungs:     Clear to auscultation bilaterally, respirations unlabored  Chest Wall:    No tenderness or deformity   Heart:    Regular rate and rhythm, S1 and S2 normal, no murmur, rub or gallop  Breast Exam:    No tenderness, masses, or nipple abnormality  Abdomen:     Soft, non-tender, bowel sounds active all four quadrants, no masses, no organomegaly.    Genitalia:    Pelvic:external genitalia normal, vagina without lesions, discharge, or tenderness, rectovaginal septum  normal. Cervix normal in appearance, no cervical motion tenderness, no adnexal masses or tenderness.  Uterus enlarged, ~ 14 week size with irregular contour, mobile, nontender.  Rectal:    Normal external sphincter.  No hemorrhoids appreciated. Internal exam not done.   Extremities:   Extremities normal, atraumatic, no cyanosis or edema  Pulses:   2+ and symmetric all extremities  Skin:   Skin color, texture, turgor normal, no rashes or lesions  Lymph nodes:   Cervical, supraclavicular, and axillary nodes normal  Neurologic:   CNII-XII intact, normal strength, sensation and reflexes throughout   .  Labs:  Lab Results   Component Value Date   WBC 4.5 05/05/2015   HGB 10.9 (L) 03/21/2015   HCT 34.6 05/05/2015   MCV 77 (L) 05/05/2015   PLT 233 05/05/2015    Lab Results  Component Value Date   CREATININE 0.61 05/05/2015   BUN 10 05/05/2015   NA 142 05/05/2015   K 4.2 05/05/2015   CL 102 05/05/2015   CO2 27 05/05/2015    Lab Results  Component Value Date   ALT 16 05/05/2015   AST 20 05/05/2015   ALKPHOS 50 05/05/2015   BILITOT <0.2 05/05/2015    No results found for: TSH   *   Healthy female exam.  Uterine fibroids Hypothyroidism Overweight  Plan:   Labs ordered: CBC, Lipid panel, Glucose, Vitamin D level.  Breast self exam  technique reviewed and patient encouraged to perform self-exam monthly. Contraception: Mirena IUD. Discussed healthy lifestyle modifications. Mammogram ordered. Pap smear performed today.  Sees Endocrinologist next week for management of hypothyroidism.  Fibroids relatively asymptomatic, no need for intervention.  Follow up in 1 year for next annual exam.   [name], MD [facility]

## 2015-12-21 NOTE — Patient Instructions (Signed)
Preventive Care for Adults, Female A healthy lifestyle and preventive care can promote health and wellness. Preventive health guidelines for women include the following key practices.  A routine yearly physical is a good way to check with your health care provider about your health and preventive screening. It is a chance to share any concerns and updates on your health and to receive a thorough exam.  Visit your dentist for a routine exam and preventive care every 6 months. Brush your teeth twice a day and floss once a day. Good oral hygiene prevents tooth decay and gum disease.  The frequency of eye exams is based on your age, health, family medical history, use of contact lenses, and other factors. Follow your health care provider's recommendations for frequency of eye exams.  Eat a healthy diet. Foods like vegetables, fruits, whole grains, low-fat dairy products, and lean protein foods contain the nutrients you need without too many calories. Decrease your intake of foods high in solid fats, added sugars, and salt. Eat the right amount of calories for you.Get information about a proper diet from your health care provider, if necessary.  Regular physical exercise is one of the most important things you can do for your health. Most adults should get at least 150 minutes of moderate-intensity exercise (any activity that increases your heart rate and causes you to sweat) each week. In addition, most adults need muscle-strengthening exercises on 2 or more days a week.  Maintain a healthy weight. The body mass index (BMI) is a screening tool to identify possible weight problems. It provides an estimate of body fat based on height and weight. Your health care provider can find your BMI and can help you achieve or maintain a healthy weight.For adults 20 years and older:  A BMI below 18.5 is considered underweight.  A BMI of 18.5 to 24.9 is normal.  A BMI of 25 to 29.9 is considered  overweight.  A BMI of 30 and above is considered obese.  Maintain normal blood lipids and cholesterol levels by exercising and minimizing your intake of saturated fat. Eat a balanced diet with plenty of fruit and vegetables. Blood tests for lipids and cholesterol should begin at age 64 and be repeated every 5 years. If your lipid or cholesterol levels are high, you are over 50, or you are at high risk for heart disease, you may need your cholesterol levels checked more frequently.Ongoing high lipid and cholesterol levels should be treated with medicines if diet and exercise are not working.  If you smoke, find out from your health care provider how to quit. If you do not use tobacco, do not start.  Lung cancer screening is recommended for adults aged 52-80 years who are at high risk for developing lung cancer because of a history of smoking. A yearly low-dose CT scan of the lungs is recommended for people who have at least a 30-pack-year history of smoking and are a current smoker or have quit within the past 15 years. A pack year of smoking is smoking an average of 1 pack of cigarettes a day for 1 year (for example: 1 pack a day for 30 years or 2 packs a day for 15 years). Yearly screening should continue until the smoker has stopped smoking for at least 15 years. Yearly screening should be stopped for people who develop a health problem that would prevent them from having lung cancer treatment.  If you are pregnant, do not drink alcohol. If you are  breastfeeding, be very cautious about drinking alcohol. If you are not pregnant and choose to drink alcohol, do not have more than 1 drink per day. One drink is considered to be 12 ounces (355 mL) of beer, 5 ounces (148 mL) of wine, or 1.5 ounces (44 mL) of liquor.  Avoid use of street drugs. Do not share needles with anyone. Ask for help if you need support or instructions about stopping the use of drugs.  High blood pressure causes heart disease and  increases the risk of stroke. Your blood pressure should be checked at least every 1 to 2 years. Ongoing high blood pressure should be treated with medicines if weight loss and exercise do not work.  If you are 25-78 years old, ask your health care provider if you should take aspirin to prevent strokes.  Diabetes screening is done by taking a blood sample to check your blood glucose level after you have not eaten for a certain period of time (fasting). If you are not overweight and you do not have risk factors for diabetes, you should be screened once every 3 years starting at age 86. If you are overweight or obese and you are 3-87 years of age, you should be screened for diabetes every year as part of your cardiovascular risk assessment.  Breast cancer screening is essential preventive care for women. You should practice "breast self-awareness." This means understanding the normal appearance and feel of your breasts and may include breast self-examination. Any changes detected, no matter how small, should be reported to a health care provider. Women in their 66s and 30s should have a clinical breast exam (CBE) by a health care provider as part of a regular health exam every 1 to 3 years. After age 43, women should have a CBE every year. Starting at age 37, women should consider having a mammogram (breast X-ray test) every year. Women who have a family history of breast cancer should talk to their health care provider about genetic screening. Women at a high risk of breast cancer should talk to their health care providers about having an MRI and a mammogram every year.  Breast cancer gene (BRCA)-related cancer risk assessment is recommended for women who have family members with BRCA-related cancers. BRCA-related cancers include breast, ovarian, tubal, and peritoneal cancers. Having family members with these cancers may be associated with an increased risk for harmful changes (mutations) in the breast  cancer genes BRCA1 and BRCA2. Results of the assessment will determine the need for genetic counseling and BRCA1 and BRCA2 testing.  Your health care provider may recommend that you be screened regularly for cancer of the pelvic organs (ovaries, uterus, and vagina). This screening involves a pelvic examination, including checking for microscopic changes to the surface of your cervix (Pap test). You may be encouraged to have this screening done every 3 years, beginning at age 78.  For women ages 79-65, health care providers may recommend pelvic exams and Pap testing every 3 years, or they may recommend the Pap and pelvic exam, combined with testing for human papilloma virus (HPV), every 5 years. Some types of HPV increase your risk of cervical cancer. Testing for HPV may also be done on women of any age with unclear Pap test results.  Other health care providers may not recommend any screening for nonpregnant women who are considered low risk for pelvic cancer and who do not have symptoms. Ask your health care provider if a screening pelvic exam is right for  you.  If you have had past treatment for cervical cancer or a condition that could lead to cancer, you need Pap tests and screening for cancer for at least 20 years after your treatment. If Pap tests have been discontinued, your risk factors (such as having a new sexual partner) need to be reassessed to determine if screening should resume. Some women have medical problems that increase the chance of getting cervical cancer. In these cases, your health care provider may recommend more frequent screening and Pap tests.  Colorectal cancer can be detected and often prevented. Most routine colorectal cancer screening begins at the age of 50 years and continues through age 75 years. However, your health care provider may recommend screening at an earlier age if you have risk factors for colon cancer. On a yearly basis, your health care provider may provide  home test kits to check for hidden blood in the stool. Use of a small camera at the end of a tube, to directly examine the colon (sigmoidoscopy or colonoscopy), can detect the earliest forms of colorectal cancer. Talk to your health care provider about this at age 50, when routine screening begins. Direct exam of the colon should be repeated every 5-10 years through age 75 years, unless early forms of precancerous polyps or small growths are found.  People who are at an increased risk for hepatitis B should be screened for this virus. You are considered at high risk for hepatitis B if:  You were born in a country where hepatitis B occurs often. Talk with your health care provider about which countries are considered high risk.  Your parents were born in a high-risk country and you have not received a shot to protect against hepatitis B (hepatitis B vaccine).  You have HIV or AIDS.  You use needles to inject street drugs.  You live with, or have sex with, someone who has hepatitis B.  You get hemodialysis treatment.  You take certain medicines for conditions like cancer, organ transplantation, and autoimmune conditions.  Hepatitis C blood testing is recommended for all people born from 1945 through 1965 and any individual with known risks for hepatitis C.  Practice safe sex. Use condoms and avoid high-risk sexual practices to reduce the spread of sexually transmitted infections (STIs). STIs include gonorrhea, chlamydia, syphilis, trichomonas, herpes, HPV, and human immunodeficiency virus (HIV). Herpes, HIV, and HPV are viral illnesses that have no cure. They can result in disability, cancer, and death.  You should be screened for sexually transmitted illnesses (STIs) including gonorrhea and chlamydia if:  You are sexually active and are younger than 24 years.  You are older than 24 years and your health care provider tells you that you are at risk for this type of infection.  Your sexual  activity has changed since you were last screened and you are at an increased risk for chlamydia or gonorrhea. Ask your health care provider if you are at risk.  If you are at risk of being infected with HIV, it is recommended that you take a prescription medicine daily to prevent HIV infection. This is called preexposure prophylaxis (PrEP). You are considered at risk if:  You are sexually active and do not regularly use condoms or know the HIV status of your partner(s).  You take drugs by injection.  You are sexually active with a partner who has HIV.  Talk with your health care provider about whether you are at high risk of being infected with HIV. If   you choose to begin PrEP, you should first be tested for HIV. You should then be tested every 3 months for as long as you are taking PrEP.  Osteoporosis is a disease in which the bones lose minerals and strength with aging. This can result in serious bone fractures or breaks. The risk of osteoporosis can be identified using a bone density scan. Women ages 1 years and over and women at risk for fractures or osteoporosis should discuss screening with their health care providers. Ask your health care provider whether you should take a calcium supplement or vitamin D to reduce the rate of osteoporosis.  Menopause can be associated with physical symptoms and risks. Hormone replacement therapy is available to decrease symptoms and risks. You should talk to your health care provider about whether hormone replacement therapy is right for you.  Use sunscreen. Apply sunscreen liberally and repeatedly throughout the day. You should seek shade when your shadow is shorter than you. Protect yourself by wearing long sleeves, pants, a wide-brimmed hat, and sunglasses year round, whenever you are outdoors.  Once a month, do a whole body skin exam, using a mirror to look at the skin on your back. Tell your health care provider of new moles, moles that have irregular  borders, moles that are larger than a pencil eraser, or moles that have changed in shape or color.  Stay current with required vaccines (immunizations).  Influenza vaccine. All adults should be immunized every year.  Tetanus, diphtheria, and acellular pertussis (Td, Tdap) vaccine. Pregnant women should receive 1 dose of Tdap vaccine during each pregnancy. The dose should be obtained regardless of the length of time since the last dose. Immunization is preferred during the 27th-36th week of gestation. An adult who has not previously received Tdap or who does not know her vaccine status should receive 1 dose of Tdap. This initial dose should be followed by tetanus and diphtheria toxoids (Td) booster doses every 10 years. Adults with an unknown or incomplete history of completing a 3-dose immunization series with Td-containing vaccines should begin or complete a primary immunization series including a Tdap dose. Adults should receive a Td booster every 10 years.  Varicella vaccine. An adult without evidence of immunity to varicella should receive 2 doses or a second dose if she has previously received 1 dose. Pregnant females who do not have evidence of immunity should receive the first dose after pregnancy. This first dose should be obtained before leaving the health care facility. The second dose should be obtained 4-8 weeks after the first dose.  Human papillomavirus (HPV) vaccine. Females aged 13-26 years who have not received the vaccine previously should obtain the 3-dose series. The vaccine is not recommended for use in pregnant females. However, pregnancy testing is not needed before receiving a dose. If a female is found to be pregnant after receiving a dose, no treatment is needed. In that case, the remaining doses should be delayed until after the pregnancy. Immunization is recommended for any person with an immunocompromised condition through the age of 24 years if she did not get any or all doses  earlier. During the 3-dose series, the second dose should be obtained 4-8 weeks after the first dose. The third dose should be obtained 24 weeks after the first dose and 16 weeks after the second dose.  Zoster vaccine. One dose is recommended for adults aged 97 years or older unless certain conditions are present.  Measles, mumps, and rubella (MMR) vaccine. Adults born  before 1957 generally are considered immune to measles and mumps. Adults born in [birth year] or later should have 1 or more doses of MMR vaccine unless there is a contraindication to the vaccine or there is laboratory evidence of immunity to each of the three diseases. A routine second dose of MMR vaccine should be obtained at least 28 days after the first dose for students attending postsecondary schools, health care workers, or international travelers. People who received inactivated measles vaccine or an unknown type of measles vaccine during 1963-1967 should receive 2 doses of MMR vaccine. People who received inactivated mumps vaccine or an unknown type of mumps vaccine before 1979 and are at high risk for mumps infection should consider immunization with 2 doses of MMR vaccine. For females of childbearing age, rubella immunity should be determined. If there is no evidence of immunity, females who are not pregnant should be vaccinated. If there is no evidence of immunity, females who are pregnant should delay immunization until after pregnancy. Unvaccinated health care workers born before 60 who lack laboratory evidence of measles, mumps, or rubella immunity or laboratory confirmation of disease should consider measles and mumps immunization with 2 doses of MMR vaccine or rubella immunization with 1 dose of MMR vaccine.  Pneumococcal 13-valent conjugate (PCV13) vaccine. When indicated, a person who is uncertain of his immunization history and has no record of immunization should receive the PCV13 vaccine. All adults 61 years of age and older  should receive this vaccine. An adult aged 92 years or older who has certain medical conditions and has not been previously immunized should receive 1 dose of PCV13 vaccine. This PCV13 should be followed with a dose of pneumococcal polysaccharide (PPSV23) vaccine. Adults who are at high risk for pneumococcal disease should obtain the PPSV23 vaccine at least 8 weeks after the dose of PCV13 vaccine. Adults older than 40 years of age who have normal immune system function should obtain the PPSV23 vaccine dose at least 1 year after the dose of PCV13 vaccine.  Pneumococcal polysaccharide (PPSV23) vaccine. When PCV13 is also indicated, PCV13 should be obtained first. All adults aged 2 years and older should be immunized. An adult younger than age 30 years who has certain medical conditions should be immunized. Any person who resides in a nursing home or long-term care facility should be immunized. An adult smoker should be immunized. People with an immunocompromised condition and certain other conditions should receive both PCV13 and PPSV23 vaccines. People with human immunodeficiency virus (HIV) infection should be immunized as soon as possible after diagnosis. Immunization during chemotherapy or radiation therapy should be avoided. Routine use of PPSV23 vaccine is not recommended for American Indians, [place] Natives, or people younger than 65 years unless there are medical conditions that require PPSV23 vaccine. When indicated, people who have unknown immunization and have no record of immunization should receive PPSV23 vaccine. One-time revaccination 5 years after the first dose of PPSV23 is recommended for people aged 19-64 years who have chronic kidney failure, nephrotic syndrome, asplenia, or immunocompromised conditions. People who received 1-2 doses of PPSV23 before age 44 years should receive another dose of PPSV23 vaccine at age 83 years or later if at least 5 years have passed since the previous dose. Doses  of PPSV23 are not needed for people immunized with PPSV23 at or after age 20 years.  Meningococcal vaccine. Adults with asplenia or persistent complement component deficiencies should receive 2 doses of quadrivalent meningococcal conjugate (MenACWY-D) vaccine. The doses should be obtained  at least 2 months apart. Microbiologists working with certain meningococcal bacteria, [place] recruits, people at risk during an outbreak, and people who travel to or live in countries with a high rate of meningitis should be immunized. A first-year college student up through age 28 years who is living in a residence hall should receive a dose if she did not receive a dose on or after her 16th birthday. Adults who have certain high-risk conditions should receive one or more doses of vaccine.  Hepatitis A vaccine. Adults who wish to be protected from this disease, have certain high-risk conditions, work with hepatitis A-infected animals, work in hepatitis A research labs, or travel to or work in countries with a high rate of hepatitis A should be immunized. Adults who were previously unvaccinated and who anticipate close contact with an international adoptee during the first 60 days after arrival in the [place] from a country with a high rate of hepatitis A should be immunized.  Hepatitis B vaccine. Adults who wish to be protected from this disease, have certain high-risk conditions, may be exposed to blood or other infectious body fluids, are household contacts or sex partners of hepatitis B positive people, are clients or workers in certain care facilities, or travel to or work in countries with a high rate of hepatitis B should be immunized.  Haemophilus influenzae type b (Hib) vaccine. A previously unvaccinated person with asplenia or sickle cell disease or having a scheduled splenectomy should receive 1 dose of Hib vaccine. Regardless of previous immunization, a recipient of a hematopoietic stem cell transplant  should receive a 3-dose series 6-12 months after her successful transplant. Hib vaccine is not recommended for adults with HIV infection. Preventive Services / Frequency Ages 71 to 87 years  Blood pressure check.** / Every 3-5 years.  Lipid and cholesterol check.** / Every 5 years beginning at age 1.  Clinical breast exam.** / Every 3 years for women in their 3s and 31s.  BRCA-related cancer risk assessment.** / For women who have family members with a BRCA-related cancer (breast, ovarian, tubal, or peritoneal cancers).  Pap test.** / Every 2 years from ages 50 through 86. Every 3 years starting at age 87 through age 7 or 75 with a history of 3 consecutive normal Pap tests.  HPV screening.** / Every 3 years from ages 59 through ages 35 to 6 with a history of 3 consecutive normal Pap tests.  Hepatitis C blood test.** / For any individual with known risks for hepatitis C.  Skin self-exam. / Monthly.  Influenza vaccine. / Every year.  Tetanus, diphtheria, and acellular pertussis (Tdap, Td) vaccine.** / Consult your health care provider. Pregnant women should receive 1 dose of Tdap vaccine during each pregnancy. 1 dose of Td every 10 years.  Varicella vaccine.** / Consult your health care provider. Pregnant females who do not have evidence of immunity should receive the first dose after pregnancy.  HPV vaccine. / 3 doses over 6 months, if 72 and younger. The vaccine is not recommended for use in pregnant females. However, pregnancy testing is not needed before receiving a dose.  Measles, mumps, rubella (MMR) vaccine.** / You need at least 1 dose of MMR if you were born in 1957 or later. You may also need a 2nd dose. For females of childbearing age, rubella immunity should be determined. If there is no evidence of immunity, females who are not pregnant should be vaccinated. If there is no evidence of immunity, females who are  pregnant should delay immunization until after  pregnancy.  Pneumococcal 13-valent conjugate (PCV13) vaccine.** / Consult your health care provider.  Pneumococcal polysaccharide (PPSV23) vaccine.** / 1 to 2 doses if you smoke cigarettes or if you have certain conditions.  Meningococcal vaccine.** / 1 dose if you are age 87 to 44 years and a Market researcher living in a residence hall, or have one of several medical conditions, you need to get vaccinated against meningococcal disease. You may also need additional booster doses.  Hepatitis A vaccine.** / Consult your health care provider.  Hepatitis B vaccine.** / Consult your health care provider.  Haemophilus influenzae type b (Hib) vaccine.** / Consult your health care provider. Ages 86 to 38 years  Blood pressure check.** / Every year.  Lipid and cholesterol check.** / Every 5 years beginning at age 49 years.  Lung cancer screening. / Every year if you are aged 71-80 years and have a 30-pack-year history of smoking and currently smoke or have quit within the past 15 years. Yearly screening is stopped once you have quit smoking for at least 15 years or develop a health problem that would prevent you from having lung cancer treatment.  Clinical breast exam.** / Every year after age 51 years.  BRCA-related cancer risk assessment.** / For women who have family members with a BRCA-related cancer (breast, ovarian, tubal, or peritoneal cancers).  Mammogram.** / Every year beginning at age 18 years and continuing for as long as you are in good health. Consult with your health care provider.  Pap test.** / Every 3 years starting at age 63 years through age 37 or 57 years with a history of 3 consecutive normal Pap tests.  HPV screening.** / Every 3 years from ages 41 years through ages 76 to 23 years with a history of 3 consecutive normal Pap tests.  Fecal occult blood test (FOBT) of stool. / Every year beginning at age 36 years and continuing until age 51 years. You may not need  to do this test if you get a colonoscopy every 10 years.  Flexible sigmoidoscopy or colonoscopy.** / Every 5 years for a flexible sigmoidoscopy or every 10 years for a colonoscopy beginning at age 36 years and continuing until age 35 years.  Hepatitis C blood test.** / For all people born from [birth year] through 1965 and any individual with known risks for hepatitis C.  Skin self-exam. / Monthly.  Influenza vaccine. / Every year.  Tetanus, diphtheria, and acellular pertussis (Tdap/Td) vaccine.** / Consult your health care provider. Pregnant women should receive 1 dose of Tdap vaccine during each pregnancy. 1 dose of Td every 10 years.  Varicella vaccine.** / Consult your health care provider. Pregnant females who do not have evidence of immunity should receive the first dose after pregnancy.  Zoster vaccine.** / 1 dose for adults aged 73 years or older.  Measles, mumps, rubella (MMR) vaccine.** / You need at least 1 dose of MMR if you were born in 1957 or later. You may also need a second dose. For females of childbearing age, rubella immunity should be determined. If there is no evidence of immunity, females who are not pregnant should be vaccinated. If there is no evidence of immunity, females who are pregnant should delay immunization until after pregnancy.  Pneumococcal 13-valent conjugate (PCV13) vaccine.** / Consult your health care provider.  Pneumococcal polysaccharide (PPSV23) vaccine.** / 1 to 2 doses if you smoke cigarettes or if you have certain conditions.  Meningococcal vaccine.** /  Consult your health care provider.  Hepatitis A vaccine.** / Consult your health care provider.  Hepatitis B vaccine.** / Consult your health care provider.  Haemophilus influenzae type b (Hib) vaccine.** / Consult your health care provider. Ages 80 years and over  Blood pressure check.** / Every year.  Lipid and cholesterol check.** / Every 5 years beginning at age 62 years.  Lung cancer  screening. / Every year if you are aged 32-80 years and have a 30-pack-year history of smoking and currently smoke or have quit within the past 15 years. Yearly screening is stopped once you have quit smoking for at least 15 years or develop a health problem that would prevent you from having lung cancer treatment.  Clinical breast exam.** / Every year after age 61 years.  BRCA-related cancer risk assessment.** / For women who have family members with a BRCA-related cancer (breast, ovarian, tubal, or peritoneal cancers).  Mammogram.** / Every year beginning at age 39 years and continuing for as long as you are in good health. Consult with your health care provider.  Pap test.** / Every 3 years starting at age 85 years through age 74 or 72 years with 3 consecutive normal Pap tests. Testing can be stopped between 65 and 70 years with 3 consecutive normal Pap tests and no abnormal Pap or HPV tests in the past 10 years.  HPV screening.** / Every 3 years from ages 55 years through ages 67 or 77 years with a history of 3 consecutive normal Pap tests. Testing can be stopped between 65 and 70 years with 3 consecutive normal Pap tests and no abnormal Pap or HPV tests in the past 10 years.  Fecal occult blood test (FOBT) of stool. / Every year beginning at age 81 years and continuing until age 22 years. You may not need to do this test if you get a colonoscopy every 10 years.  Flexible sigmoidoscopy or colonoscopy.** / Every 5 years for a flexible sigmoidoscopy or every 10 years for a colonoscopy beginning at age 67 years and continuing until age 22 years.  Hepatitis C blood test.** / For all people born from [birth year] through 1965 and any individual with known risks for hepatitis C.  Osteoporosis screening.** / A one-time screening for women ages 8 years and over and women at risk for fractures or osteoporosis.  Skin self-exam. / Monthly.  Influenza vaccine. / Every year.  Tetanus, diphtheria, and  acellular pertussis (Tdap/Td) vaccine.** / 1 dose of Td every 10 years.  Varicella vaccine.** / Consult your health care provider.  Zoster vaccine.** / 1 dose for adults aged 56 years or older.  Pneumococcal 13-valent conjugate (PCV13) vaccine.** / Consult your health care provider.  Pneumococcal polysaccharide (PPSV23) vaccine.** / 1 dose for all adults aged 15 years and older.  Meningococcal vaccine.** / Consult your health care provider.  Hepatitis A vaccine.** / Consult your health care provider.  Hepatitis B vaccine.** / Consult your health care provider.  Haemophilus influenzae type b (Hib) vaccine.** / Consult your health care provider. ** Family history and personal history of risk and conditions may change your health care provider's recommendations.   This information is not intended to replace advice given to you by your health care provider. Make sure you discuss any questions you have with your health care provider.   Document Released: 05/29/2001 Document Revised: 04/23/2014 Document Reviewed: 08/28/2010 Elsevier Interactive Patient Education [employer].

## 2015-12-22 LAB — COMPREHENSIVE METABOLIC PANEL
ALBUMIN: 4.5 g/dL (ref 3.5–5.5)
ALK PHOS: 37 IU/L — AB (ref 39–117)
ALT: 10 IU/L (ref 0–32)
AST: 15 IU/L (ref 0–40)
Albumin/Globulin Ratio: 1.7 (ref 1.2–2.2)
BUN / CREAT RATIO: 13 (ref 9–23)
BUN: 11 mg/dL (ref 6–24)
Bilirubin Total: 0.4 mg/dL (ref 0.0–1.2)
CHLORIDE: 101 mmol/L (ref 96–106)
CO2: 25 mmol/L (ref 18–29)
CREATININE: 0.82 mg/dL (ref 0.57–1.00)
Calcium: 8.8 mg/dL (ref 8.7–10.2)
GFR calc non Af Amer: 90 mL/min/{1.73_m2} (ref >59)
GFR, EST AFRICAN AMERICAN: 104 mL/min/{1.73_m2} (ref >59)
GLUCOSE: 76 mg/dL (ref 65–99)
Globulin, Total: 2.7 g/dL (ref 1.5–4.5)
Potassium: 4.3 mmol/L (ref 3.5–5.2)
Sodium: 138 mmol/L (ref 134–144)
TOTAL PROTEIN: 7.2 g/dL (ref 6.0–8.5)

## 2015-12-22 LAB — CBC
HEMOGLOBIN: 13.3 g/dL (ref 11.1–15.9)
Hematocrit: 37.8 % (ref 34.0–46.6)
MCH: 27.2 pg (ref 26.6–33.0)
MCHC: 35.2 g/dL (ref 31.5–35.7)
MCV: 77 fL — ABNORMAL LOW (ref 79–97)
PLATELETS: 218 10*3/uL (ref 150–379)
RBC: 4.89 x10E6/uL (ref 3.77–5.28)
RDW: 13.3 % (ref 12.3–15.4)
WBC: 3.9 10*3/uL (ref 3.4–10.8)

## 2015-12-22 LAB — LIPID PANEL
CHOL/HDL RATIO: 2.1 ratio (ref 0.0–4.4)
Cholesterol, Total: 152 mg/dL (ref 100–199)
HDL: 71 mg/dL (ref >39)
LDL CALC: 74 mg/dL (ref 0–99)
Triglycerides: 37 mg/dL (ref 0–149)
VLDL CHOLESTEROL CAL: 7 mg/dL (ref 5–40)

## 2015-12-22 LAB — VITAMIN D 25 HYDROXY (VIT D DEFICIENCY, FRACTURES): VIT D 25 HYDROXY: 24.3 ng/mL — AB (ref 30.0–100.0)

## 2015-12-23 DIAGNOSIS — E669 Obesity, unspecified: Secondary | ICD-10-CM | POA: Insufficient documentation

## 2015-12-23 DIAGNOSIS — E663 Overweight: Secondary | ICD-10-CM | POA: Insufficient documentation

## 2015-12-23 LAB — PAP IG AND HPV HIGH-RISK
HPV, high-risk: NEGATIVE
PAP Smear Comment: 0

## 2015-12-27 ENCOUNTER — Telehealth: Payer: Self-pay

## 2015-12-27 NOTE — Telephone Encounter (Signed)
Called pt no answer, LM for pt to call back  

## 2015-12-27 NOTE — Telephone Encounter (Signed)
-----   Message from [name], MD sent at 12/26/2015  4:30 PM EDT ----- I feel as though I had previously sent a message about the rest of her labs, but maybe I didn't.  All her other labs are normal except mild Vit D insufficiency, should take a regular OTC Vitamin D supplement (or even a regular multivitamin with Vit D in it if she is not already taking one)

## 2015-12-28 NOTE — Telephone Encounter (Signed)
Called pt informed her of normal pap, and of vitamin D deficiency. Pt gave verbal understanding.

## 2016-10-09 ENCOUNTER — Ambulatory Visit (INDEPENDENT_AMBULATORY_CARE_PROVIDER_SITE_OTHER): Payer: BLUE CROSS/BLUE SHIELD | Admitting: Obstetrics and Gynecology

## 2016-10-09 ENCOUNTER — Encounter: Payer: Self-pay | Admitting: Obstetrics and Gynecology

## 2016-10-09 VITALS — BP 134/82 | HR 85 | Ht 65.0 in | Wt 173.8 lb

## 2016-10-09 DIAGNOSIS — N764 Abscess of vulva: Secondary | ICD-10-CM

## 2016-10-09 MED ORDER — FLUCONAZOLE 150 MG PO TABS: 150.0000 mg | ORAL_TABLET | Freq: Once | ORAL | 3 refills | Status: AC

## 2016-10-09 MED ORDER — SULFAMETHOXAZOLE-TRIMETHOPRIM 800-160 MG PO TABS: 1.0000 | ORAL_TABLET | Freq: Two times a day (BID) | ORAL | 1 refills | Status: DC

## 2016-10-09 NOTE — Progress Notes (Signed)
   GYNECOLOGY CLINIC PROGRESS NOTE Subjective:     Emily Howard is a 41 y.o. G70P3003 female who presents for evaluation of a probable cutaneous abscess. Lesion is located in the vulvar region. Onset was 3 days ago. Symptoms have worsened, beginning 1 day ago. Began noting chills, increased tenderness requiring Ibuprofen. Patient does not have previous history of cutaneous abscesses.    The following portions of the patient's history were reviewed and updated as appropriate: allergies, current medications, past family history, past medical history, past social history, past surgical history and problem list.    Objective:   Blood pressure 134/82, pulse 85, height 5\' 5"  (1.651 m), weight 173 lb 12.8 oz (78.8 kg), last menstrual period 09/08/2016, not currently breastfeeding. General appearance: alert and no distress Pelvic: external genitalia with left labial abscess present. There is an area characterized by cystic mass with a central pore, a soft mobile subQ mass, a subcutaneous mass consistent with a cutaneous abscess measuring 2.5 cm in greatest dimension. Internal exam not performed.   No erythema present.  Skin: Skin color, texture, turgor normal. No rashes or lesions   Procedure Informed consent obtained.  The area was prepped in the usual manner and the skin overlying the abscess was anesthetized with Hurricane gel.  The area was sharply incised and approx 5 ccs of purulent material was obtained. Packing was not inserted.    Assessment:     Cutaneous abscess.    Plan:    Apply hot compresses frequently to promote drainage. Oral antibiotics -- see med orders. I & D procedure as above. RTC PRN.     [name], MD [facility]

## 2016-10-13 LAB — ANAEROBIC AND AEROBIC CULTURE

## 2016-10-16 ENCOUNTER — Telehealth: Payer: Self-pay

## 2016-10-16 NOTE — Telephone Encounter (Signed)
Called pt informed her of information below. Pt gave verbal understanding, states that abscess has healed well.

## 2016-10-16 NOTE — Telephone Encounter (Signed)
-----   Message from [name], MD sent at 10/16/2016  8:30 AM EDT ----- Please inform patient of MRSA skin infection (from vulvar abscess).  Is sensitive to antibiotics prescribed (Bactrim DS).

## 2017-10-07 ENCOUNTER — Ambulatory Visit (INDEPENDENT_AMBULATORY_CARE_PROVIDER_SITE_OTHER): Payer: 59 | Admitting: Obstetrics and Gynecology

## 2017-10-07 ENCOUNTER — Encounter: Payer: Self-pay | Admitting: Obstetrics and Gynecology

## 2017-10-07 VITALS — BP 111/70 | HR 83 | Ht 65.0 in | Wt 177.6 lb

## 2017-10-07 DIAGNOSIS — L739 Follicular disorder, unspecified: Secondary | ICD-10-CM

## 2017-10-07 MED ORDER — LIDOCAINE HCL URETHRAL/MUCOSAL 2 % EX GEL: 1.0000 "application " | CUTANEOUS | 2 refills | Status: DC | PRN

## 2017-10-07 NOTE — Progress Notes (Signed)
Pt stated that she has a vaginal abscess that is swollen and painful x days.

## 2017-10-07 NOTE — Patient Instructions (Signed)

## 2017-10-10 ENCOUNTER — Encounter: Payer: Self-pay | Admitting: Obstetrics and Gynecology

## 2017-10-10 NOTE — Progress Notes (Signed)
    GYNECOLOGY PROGRESS NOTE  Subjective:    Patient ID: Griselle Rufer, female    DOB: [birth year], 42 y.o.   MRN: [id number]  HPI  Patient is a 42 y.o. G32P3003 female who presents for complaints of vulvar abscess x 4 days.  Patient states that she has tried sitz baths for several days, but has not noted any improvement. Also notes that it is now becoming uncomfortable to sit.   The following portions of the patient's history were reviewed and updated as appropriate: allergies, current medications, past family history, past medical history, past social history, past surgical history and problem list.  Review of Systems Pertinent items noted in HPI and remainder of comprehensive ROS otherwise negative.   Objective:   Blood pressure 111/70, pulse 83, height 5\' 5"  (1.651 m), weight 177 lb 9.6 oz (80.6 kg), last menstrual period 09/29/2017. General appearance: alert and no distress Pelvic: external genitalia with bilateral 1 cm lesions consistent with folliculitis on labia majora. No erythema, mildly tender to palpation. Internal exam not performed. Fluctuant, but no induration.    Assessment:   Folliculitis  Plan:   - Patient with areas of folliculitis on bilateral labia minora. Advised on continuing sitz baths, can also apply witch hazel to region.  To return if lesions continue to enlarge, fevers or chills ensue.     [name], MD [facility]

## 2017-10-23 ENCOUNTER — Encounter: Payer: Self-pay | Admitting: Obstetrics and Gynecology

## 2017-10-23 ENCOUNTER — Ambulatory Visit (INDEPENDENT_AMBULATORY_CARE_PROVIDER_SITE_OTHER): Payer: 59 | Admitting: Obstetrics and Gynecology

## 2017-10-23 VITALS — BP 100/69 | HR 76 | Ht 65.0 in | Wt 177.1 lb

## 2017-10-23 DIAGNOSIS — Z1231 Encounter for screening mammogram for malignant neoplasm of breast: Secondary | ICD-10-CM | POA: Diagnosis not present

## 2017-10-23 DIAGNOSIS — E663 Overweight: Secondary | ICD-10-CM

## 2017-10-23 DIAGNOSIS — Z8639 Personal history of other endocrine, nutritional and metabolic disease: Secondary | ICD-10-CM | POA: Diagnosis not present

## 2017-10-23 DIAGNOSIS — Z13220 Encounter for screening for lipoid disorders: Secondary | ICD-10-CM | POA: Diagnosis not present

## 2017-10-23 DIAGNOSIS — Z975 Presence of (intrauterine) contraceptive device: Secondary | ICD-10-CM

## 2017-10-23 DIAGNOSIS — Z01419 Encounter for gynecological examination (general) (routine) without abnormal findings: Secondary | ICD-10-CM

## 2017-10-23 DIAGNOSIS — E039 Hypothyroidism, unspecified: Secondary | ICD-10-CM | POA: Diagnosis not present

## 2017-10-23 NOTE — Patient Instructions (Signed)

## 2017-10-23 NOTE — Progress Notes (Signed)
Pt stated that she doing well.

## 2017-10-23 NOTE — Progress Notes (Signed)
GYNECOLOGY ANNUAL PHYSICAL EXAM PROGRESS NOTE  Subjective:    Emily Howard is a 42 y.o. G90P3003 female with a h/o hypothyroidism and uterine fibroids who presents for an annual exam. The patient has no complaints today. The patient is sexually active.  The patient wears seatbelts: yes. The patient participates in regular exercise: yes (3-4 x weekly). Has the patient ever been transfused or tattooed?: no. The patient reports that there is not domestic violence in her life.    Gynecologic History Menarche age: 35 Patient's last menstrual period was 12/13/2015. Contraception: Mirena IUD (inserted 05/2015) History of STI's:  Denies  Last Pap: 12/2015. Results were: normal.  Denies h/o abnormal pap smears. Last mammogram: patient has never had a mammogram.   OB History  Gravida Para Term Preterm AB Living  3 3 3  0 0 3  SAB TAB Ectopic Multiple Live Births  0 0 0 0 3    # Outcome Date GA Lbr Len/2nd Weight Sex Delivery Anes PTL Lv  3 Term 03/20/15 [redacted]w[redacted]d  7 lb 12.3 oz (3.525 kg) F Vag-Spont EPI N LIV     Complications: Postpartum hemorrhage     Apgar1: 8  Apgar5: 9  2 Term 2007   10 lb (4.536 kg) M Vag-Spont   LIV  1 Term 2005   7 lb 6 oz (3.345 kg) M Vag-Spont   LIV    Obstetric Comments  Fibroid uterus in pregnancy    Past Medical History:  Diagnosis Date  . Fibroid, uterine   . GERD (gastroesophageal reflux disease)   . H/O hemorrhoidectomy   . Hypotension   . Hypothyroidism   . Insomnia   . Myalgia   . Neutropenia ([place])   . Vitamin D deficiency     Past Surgical History:  Procedure Laterality Date  . HEMORRHOID SURGERY    . THYROIDECTOMY  2010    Family History  Problem Relation Age of Onset  . Hypertension Mother   . Pancreatic cancer Father     Social History   Socioeconomic History  . Marital status: Married    Spouse name: Not on file  . Number of children: Not on file  . Years of education: Not on file  . Highest education level: Not on file    Occupational History  . Not on file  Social Needs  . Financial resource strain: Not on file  . Food insecurity:    Worry: Not on file    Inability: Not on file  . Transportation needs:    Medical: Not on file    Non-medical: Not on file  Tobacco Use  . Smoking status: Never Smoker  . Smokeless tobacco: Never Used  Substance and Sexual Activity  . Alcohol use: No  . Drug use: No  . Sexual activity: Yes    Birth control/protection: IUD    Comment: Mirena   Lifestyle  . Physical activity:    Days per week: Not on file    Minutes per session: Not on file  . Stress: Not on file  Relationships  . Social connections:    Talks on phone: Not on file    Gets together: Not on file    Attends religious service: Not on file    Active member of club or organization: Not on file    Attends meetings of clubs or organizations: Not on file    Relationship status: Not on file  . Intimate partner violence:    Fear of current or  ex partner: Not on file    Emotionally abused: Not on file    Physically abused: Not on file    Forced sexual activity: Not on file  Other Topics Concern  . Not on file  Social History Narrative  . Not on file    Current Outpatient Medications on File Prior to Visit  Medication Sig Dispense Refill  . levonorgestrel (MIRENA, 52 MG,) 20 MCG/24HR IUD 1 Intra Uterine Device (1 each total) by Intrauterine route once. 1 each 0  . levothyroxine (SYNTHROID) 125 MCG tablet Take 1 tablet (125 mcg total) by mouth daily before breakfast. 60 tablet 2  . lidocaine (XYLOCAINE) 2 % jelly Apply 1 application topically as needed. 30 mL 2  . liothyronine (CYTOMEL) 5 MCG tablet Take 5 mcg by mouth daily.    . Multiple Vitamin (MULTI-VITAMINS PO) Take by mouth.     No current facility-administered medications on file prior to visit.     Allergies  Allergen Reactions  . Astramorph [Morphine] Itching    Itching for a week  . Chloroquine Hives and Itching     Review of  Systems Constitutional: negative for chills, fatigue, fevers and sweats Eyes: negative for irritation, redness and visual disturbance Ears, nose, mouth, throat, and face: negative for hearing loss, nasal congestion, snoring and tinnitus Respiratory: negative for asthma, cough, sputum Cardiovascular: negative for chest pain, dyspnea, exertional chest pressure/discomfort, irregular heart beat, palpitations and syncope Gastrointestinal: negative for abdominal pain, change in bowel habits, nausea and vomiting Genitourinary: negative for abnormal menstrual periods, genital lesions, sexual problems and vaginal discharge, dysuria and urinary incontinence Integument/breast: negative for breast lump, breast tenderness and nipple discharge Hematologic/lymphatic: negative for bleeding and easy bruising Musculoskeletal:negative for back pain and muscle weakness Neurological: negative for dizziness, headaches, vertigo and weakness Endocrine: negative for diabetic symptoms including polydipsia, polyuria and skin dryness Allergic/Immunologic: negative for hay fever and urticaria       Objective:  Blood pressure 100/69, pulse 76, height 5\' 5"  (1.651 m), weight 177 lb 1.6 oz (80.3 kg), last menstrual period 10/11/2017. Body mass index is 29.47 kg/m.   General Appearance:    Alert, cooperative, no distress, appears stated age, overweight  Head:    Normocephalic, without obvious abnormality, atraumatic  Eyes:    PERRL, conjunctiva/corneas clear, EOM's intact, both eyes  Ears:    Normal external ear canals, both ears  Nose:   Nares normal, septum midline, mucosa normal, no drainage or sinus tenderness  Throat:   Lips, mucosa, and tongue normal; teeth and gums normal  Neck:   Supple, symmetrical, trachea midline, no adenopathy; thyroid: no enlargement/tenderness/nodules; no carotid bruit or JVD  Back:     Symmetric, no curvature, ROM normal, no CVA tenderness  Lungs:     Clear to auscultation bilaterally,  respirations unlabored  Chest Wall:    No tenderness or deformity   Heart:    Regular rate and rhythm, S1 and S2 normal, no murmur, rub or gallop  Breast Exam:    No tenderness, masses, or nipple abnormality  Abdomen:     Soft, non-tender, bowel sounds active all four quadrants, no masses, no organomegaly.    Genitalia:    Pelvic:external genitalia normal, vagina without lesions, discharge, or tenderness, rectovaginal septum  normal. Cervix normal in appearance, no cervical motion tenderness, no adnexal masses or tenderness.  Uterus 12 week size with irregular contour, mobile, nontender.  Rectal:    Normal external sphincter.  No hemorrhoids appreciated. Internal exam not done.  Extremities:   Extremities normal, atraumatic, no cyanosis or edema  Pulses:   2+ and symmetric all extremities  Skin:   Skin color, texture, turgor normal, no rashes or lesions  Lymph nodes:   Cervical, supraclavicular, and axillary nodes normal  Neurologic:   CNII-XII intact, normal strength, sensation and reflexes throughout   .  Labs:  Lab Results  Component Value Date   WBC 3.9 12/21/2015   HGB 13.3 12/21/2015   HCT 37.8 12/21/2015   MCV 77 (L) 12/21/2015   PLT 218 12/21/2015    Lab Results  Component Value Date   CREATININE 0.82 12/21/2015   BUN 11 12/21/2015   NA 138 12/21/2015   K 4.3 12/21/2015   CL 101 12/21/2015   CO2 25 12/21/2015    Lab Results  Component Value Date   ALT 10 12/21/2015   AST 15 12/21/2015   ALKPHOS 37 (L) 12/21/2015   BILITOT 0.4 12/21/2015    No results found for: TSH   *   Routine gynecologic exam.  Uterine fibroids Hypothyroidism Overweight History of Vitamin D deficiency  Plan:   Labs ordered: CBC, CMP, Thyroid panel, Lipid panel, Vitamin D level.  Breast self exam technique reviewed and patient encouraged to perform self-exam monthly. Contraception: Mirena IUD. Discussed healthy lifestyle modifications. Desires referral to Nutritionist as she has  plateaued with weight loss. Referral placed.  Mammogram ordered. Strongly encouraged patient to begin breast cancer screening (did not have last mammogram performed).  Pap smear up to date. Next pap smear due in 1 year. Hypothyroidism managed by Endocrinologist, but has not been seen in a while. WIll order thyroid panel. Fibroids relatively asymptomatic, no need for intervention.  Follow up in 1 year for next annual exam.   [name], MD [facility]

## 2017-10-24 LAB — LIPID PANEL
CHOLESTEROL TOTAL: 143 mg/dL (ref 100–199)
Chol/HDL Ratio: 3 ratio (ref 0.0–4.4)
HDL: 47 mg/dL (ref >39)
LDL Calculated: 87 mg/dL (ref 0–99)
Triglycerides: 44 mg/dL (ref 0–149)
VLDL Cholesterol Cal: 9 mg/dL (ref 5–40)

## 2017-10-24 LAB — CBC
HEMATOCRIT: 38.6 % (ref 34.0–46.6)
HEMOGLOBIN: 13.1 g/dL (ref 11.1–15.9)
MCH: 26.8 pg (ref 26.6–33.0)
MCHC: 33.9 g/dL (ref 31.5–35.7)
MCV: 79 fL (ref 79–97)
Platelets: 215 10*3/uL (ref 150–450)
RBC: 4.89 x10E6/uL (ref 3.77–5.28)
RDW: 13.3 % (ref 12.3–15.4)
WBC: 4.2 10*3/uL (ref 3.4–10.8)

## 2017-10-24 LAB — COMPREHENSIVE METABOLIC PANEL
ALK PHOS: 38 IU/L — AB (ref 39–117)
ALT: 5 IU/L (ref 0–32)
AST: 11 IU/L (ref 0–40)
Albumin/Globulin Ratio: 1.8 (ref 1.2–2.2)
Albumin: 4.4 g/dL (ref 3.5–5.5)
BUN/Creatinine Ratio: 11 (ref 9–23)
BUN: 9 mg/dL (ref 6–24)
Bilirubin Total: 0.4 mg/dL (ref 0.0–1.2)
CALCIUM: 8.8 mg/dL (ref 8.7–10.2)
CO2: 25 mmol/L (ref 20–29)
CREATININE: 0.83 mg/dL (ref 0.57–1.00)
Chloride: 103 mmol/L (ref 96–106)
GFR calc Af Amer: 101 mL/min/{1.73_m2} (ref >59)
GFR, EST NON AFRICAN AMERICAN: 87 mL/min/{1.73_m2} (ref >59)
GLUCOSE: 75 mg/dL (ref 65–99)
Globulin, Total: 2.5 g/dL (ref 1.5–4.5)
Potassium: 4.1 mmol/L (ref 3.5–5.2)
Sodium: 144 mmol/L (ref 134–144)
Total Protein: 6.9 g/dL (ref 6.0–8.5)

## 2017-10-24 LAB — VITAMIN D 25 HYDROXY (VIT D DEFICIENCY, FRACTURES): Vit D, 25-Hydroxy: 25.5 ng/mL — ABNORMAL LOW (ref 30.0–100.0)

## 2017-10-24 LAB — THYROID PANEL WITH TSH
FREE THYROXINE INDEX: 1.9 (ref 1.2–4.9)
T3 UPTAKE RATIO: 30 % (ref 24–39)
T4 TOTAL: 6.3 ug/dL (ref 4.5–12.0)
TSH: 14.77 u[IU]/mL — AB (ref 0.450–4.500)

## 2017-10-29 ENCOUNTER — Ambulatory Visit: Payer: Self-pay | Admitting: Adult Health

## 2017-10-30 ENCOUNTER — Telehealth: Payer: Self-pay

## 2017-10-30 NOTE — Telephone Encounter (Signed)
MALED PATIENT LETTER OF NOT LONGER ABLE TO SEE PATIENT DUE TO MISSED APPT WITH OUR OFFICE. BETH

## 2017-11-12 ENCOUNTER — Ambulatory Visit
Admission: RE | Admit: 2017-11-12 | Discharge: 2017-11-12 | Disposition: A | Discharge status: Home / Self Care | Payer: 59 | Source: Ambulatory Visit | Attending: Obstetrics and Gynecology | Admitting: Obstetrics and Gynecology

## 2017-11-12 DIAGNOSIS — Z1231 Encounter for screening mammogram for malignant neoplasm of breast: Secondary | ICD-10-CM | POA: Insufficient documentation

## 2017-11-14 ENCOUNTER — Ambulatory Visit
Admission: RE | Admit: 2017-11-14 | Discharge: 2017-11-14 | Disposition: A | Discharge status: Home / Self Care | Payer: 59 | Source: Ambulatory Visit | Attending: Obstetrics and Gynecology | Admitting: Obstetrics and Gynecology

## 2017-11-14 ENCOUNTER — Other Ambulatory Visit: Payer: Self-pay | Admitting: Obstetrics and Gynecology

## 2017-11-14 DIAGNOSIS — R928 Other abnormal and inconclusive findings on diagnostic imaging of breast: Secondary | ICD-10-CM | POA: Insufficient documentation

## 2017-11-14 DIAGNOSIS — Z1231 Encounter for screening mammogram for malignant neoplasm of breast: Secondary | ICD-10-CM

## 2018-01-10 LAB — HM DEXA SCAN

## 2019-03-30 LAB — BASIC METABOLIC PANEL
BUN: 11 (ref 4–21)
CO2: 22 (ref 13–22)
Chloride: 102 (ref 99–108)
Creatinine: 0.9 (ref 0.5–1.1)
Glucose: 79
Potassium: 4.1 (ref 3.4–5.3)
Sodium: 139 (ref 137–147)

## 2019-03-30 LAB — TSH: TSH: 9.7 — AB (ref 0.41–5.90)

## 2019-03-30 LAB — COMPREHENSIVE METABOLIC PANEL
Albumin: 4.5 (ref 3.5–5.0)
Calcium: 8.8 (ref 8.7–10.7)
GFR calc Af Amer: 94
GFR calc non Af Amer: 82
Globulin: 2.2

## 2019-03-30 LAB — CBC AND DIFFERENTIAL
HCT: 39 (ref 36–46)
Hemoglobin: 13 (ref 12.0–16.0)
Platelets: 221 (ref 150–399)
WBC: 4.3

## 2019-03-30 LAB — LIPID PANEL
Cholesterol: 139 (ref 0–200)
HDL: 50 (ref 35–70)
LDL Cholesterol: 80
Triglycerides: 36 — AB (ref 40–160)

## 2019-03-30 LAB — CBC: RBC: 4.79 (ref 3.87–5.11)

## 2019-03-30 LAB — VITAMIN D 25 HYDROXY (VIT D DEFICIENCY, FRACTURES): Vit D, 25-Hydroxy: 29.9

## 2019-09-23 ENCOUNTER — Encounter: Payer: Self-pay | Admitting: Obstetrics and Gynecology

## 2019-09-23 ENCOUNTER — Other Ambulatory Visit (HOSPITAL_COMMUNITY)
Admission: RE | Admit: 2019-09-23 | Discharge: 2019-09-23 | Disposition: A | Discharge status: Home / Self Care | Payer: 59 | Source: Ambulatory Visit | Attending: Obstetrics and Gynecology | Admitting: Obstetrics and Gynecology

## 2019-09-23 ENCOUNTER — Other Ambulatory Visit: Payer: Self-pay

## 2019-09-23 ENCOUNTER — Ambulatory Visit (INDEPENDENT_AMBULATORY_CARE_PROVIDER_SITE_OTHER): Payer: 59 | Admitting: Obstetrics and Gynecology

## 2019-09-23 VITALS — BP 112/74 | HR 80 | Ht 65.0 in | Wt 190.5 lb

## 2019-09-23 DIAGNOSIS — Z124 Encounter for screening for malignant neoplasm of cervix: Secondary | ICD-10-CM

## 2019-09-23 DIAGNOSIS — Z1231 Encounter for screening mammogram for malignant neoplasm of breast: Secondary | ICD-10-CM | POA: Diagnosis not present

## 2019-09-23 DIAGNOSIS — D259 Leiomyoma of uterus, unspecified: Secondary | ICD-10-CM

## 2019-09-23 DIAGNOSIS — Z01419 Encounter for gynecological examination (general) (routine) without abnormal findings: Secondary | ICD-10-CM | POA: Diagnosis not present

## 2019-09-23 DIAGNOSIS — E039 Hypothyroidism, unspecified: Secondary | ICD-10-CM

## 2019-09-23 DIAGNOSIS — E66811 Obesity, class 1: Secondary | ICD-10-CM

## 2019-09-23 DIAGNOSIS — T8332XA Displacement of intrauterine contraceptive device, initial encounter: Secondary | ICD-10-CM

## 2019-09-23 DIAGNOSIS — E669 Obesity, unspecified: Secondary | ICD-10-CM

## 2019-09-23 NOTE — Progress Notes (Signed)
Pt present for annual exam. Pt stated that she was doing well no problems.  

## 2019-09-23 NOTE — Progress Notes (Signed)
GYNECOLOGY ANNUAL PHYSICAL EXAM PROGRESS NOTE  Subjective:    Emily Howard is a 44 y.o. G89P3003 female with a h/o hypothyroidism and uterine fibroids who presents for an annual exam. The patient has no complaints today. The patient is sexually active.  The patient wears seatbelts: yes. The patient participates in regular exercise: no. Has the patient ever been transfused or tattooed?: no. The patient reports that there is not domestic violence in her life.    Gynecologic History Menarche age: 37 Patient's last menstrual period was 12/13/2015. Contraception: Mirena IUD (inserted 05/2015).  History of STI's:  Denies  Last Pap: 12/2015. Results were: normal.  Denies h/o abnormal pap smears. Last mammogram: 11/12/2017.    OB History  Gravida Para Term Preterm AB Living  3 3 3  0 0 3  SAB TAB Ectopic Multiple Live Births  0 0 0 0 3    # Outcome Date GA Lbr Len/2nd Weight Sex Delivery Anes PTL Lv  3 Term 03/20/15 [redacted]w[redacted]d  7 lb 12.3 oz (3.525 kg) F Vag-Spont EPI N LIV     Complications: Postpartum hemorrhage     Apgar1: 8  Apgar5: 9  2 Term 2007   10 lb (4.536 kg) M Vag-Spont   LIV  1 Term 2005   7 lb 6 oz (3.345 kg) M Vag-Spont   LIV    Obstetric Comments  Fibroid uterus in pregnancy    Past Medical History:  Diagnosis Date  . Fibroid, uterine   . GERD (gastroesophageal reflux disease)   . H/O hemorrhoidectomy   . Hypotension   . Hypothyroidism   . Insomnia   . Myalgia   . Neutropenia ([place])   . Vitamin D deficiency     Past Surgical History:  Procedure Laterality Date  . HEMORRHOID SURGERY    . THYROIDECTOMY  2010    Family History  Problem Relation Age of Onset  . Hypertension Mother   . Pancreatic cancer Father   . Breast cancer Neg Hx     Social History   Socioeconomic History  . Marital status: Married    Spouse name: Not on file  . Number of children: Not on file  . Years of education: Not on file  . Highest education level: Not on file    Occupational History  . Not on file  Tobacco Use  . Smoking status: Never Smoker  . Smokeless tobacco: Never Used  Substance and Sexual Activity  . Alcohol use: No  . Drug use: No  . Sexual activity: Yes    Birth control/protection: I.U.D.    Comment: Mirena   Other Topics Concern  . Not on file  Social History Narrative  . Not on file   Social Determinants of Health   Financial Resource Strain:   . Difficulty of Paying Living Expenses:   Food Insecurity:   . Worried About Charity fundraiser in the Last Year:   . Arboriculturist in the Last Year:   Transportation Needs:   . Film/video editor (Medical):   Marland Kitchen Lack of Transportation (Non-Medical):   Physical Activity:   . Days of Exercise per Week:   . Minutes of Exercise per Session:   Stress:   . Feeling of Stress :   Social Connections:   . Frequency of Communication with Friends and Family:   . Frequency of Social Gatherings with Friends and Family:   . Attends Religious Services:   . Active Member of Clubs or  Organizations:   . Attends Archivist Meetings:   Marland Kitchen Marital Status:   Intimate Partner Violence:   . Fear of Current or Ex-Partner:   . Emotionally Abused:   Marland Kitchen Physically Abused:   . Sexually Abused:     Current Outpatient Medications on File Prior to Visit  Medication Sig Dispense Refill  . levonorgestrel (MIRENA, 52 MG,) 20 MCG/24HR IUD 1 Intra Uterine Device (1 each total) by Intrauterine route once. 1 each 0  . levothyroxine (SYNTHROID) 125 MCG tablet Take 1 tablet (125 mcg total) by mouth daily before breakfast. 60 tablet 2  . lidocaine (XYLOCAINE) 2 % jelly Apply 1 application topically as needed. 30 mL 2  . liothyronine (CYTOMEL) 5 MCG tablet Take 5 mcg by mouth daily.    . Multiple Vitamin (MULTI-VITAMINS PO) Take by mouth.     No current facility-administered medications on file prior to visit.    Allergies  Allergen Reactions  . Astramorph [Morphine] Itching    Itching for a  week  . Chloroquine Hives and Itching     Review of Systems Constitutional: negative for chills, fatigue, fevers and sweats Eyes: negative for irritation, redness and visual disturbance Ears, nose, mouth, throat, and face: negative for hearing loss, nasal congestion, snoring and tinnitus Respiratory: negative for asthma, cough, sputum Cardiovascular: negative for chest pain, dyspnea, exertional chest pressure/discomfort, irregular heart beat, palpitations and syncope Gastrointestinal: negative for abdominal pain, change in bowel habits, nausea and vomiting Genitourinary: negative for abnormal menstrual periods, genital lesions, sexual problems and vaginal discharge, dysuria and urinary incontinence Integument/breast: negative for breast lump, breast tenderness and nipple discharge Hematologic/lymphatic: negative for bleeding and easy bruising Musculoskeletal:negative for back pain and muscle weakness Neurological: negative for dizziness, headaches, vertigo and weakness Endocrine: negative for diabetic symptoms including polydipsia, polyuria and skin dryness Allergic/Immunologic: negative for hay fever and urticaria       Objective:  Blood pressure 112/74, pulse 80, height 5\' 5"  (1.651 m), weight 190 lb 8 oz (86.4 kg), last menstrual period 09/02/2019. Body mass index is 31.7 kg/m.   General Appearance:    Alert, cooperative, no distress, appears stated age, mild obesity  Head:    Normocephalic, without obvious abnormality, atraumatic  Eyes:    PERRL, conjunctiva/corneas clear, EOM's intact, both eyes  Ears:    Normal external ear canals, both ears  Nose:   Nares normal, septum midline, mucosa normal, no drainage or sinus tenderness  Throat:   Lips, mucosa, and tongue normal; teeth and gums normal  Neck:   Supple, symmetrical, trachea midline, no adenopathy; thyroid: no enlargement/tenderness/nodules; no carotid bruit or JVD  Back:     Symmetric, no curvature, ROM normal, no CVA  tenderness  Lungs:     Clear to auscultation bilaterally, respirations unlabored  Chest Wall:    No tenderness or deformity   Heart:    Regular rate and rhythm, S1 and S2 normal, no murmur, rub or gallop  Breast Exam:    No tenderness, masses, or nipple abnormality  Abdomen:     Soft, non-tender, bowel sounds active all four quadrants, no masses, no organomegaly.    Genitalia:    Pelvic:external genitalia normal, vagina without lesions, discharge, or tenderness, rectovaginal septum  normal. Cervix normal in appearance, no cervical motion tenderness, IUD visualized.  No adnexal masses or tenderness.  Uterus normal size with irregular contour, mobile, nontender.  Rectal:    Normal external sphincter.  No hemorrhoids appreciated. Internal exam not done.   Extremities:  Extremities normal, atraumatic, no cyanosis or edema  Pulses:   2+ and symmetric all extremities  Skin:   Skin color, texture, turgor normal, no rashes or lesions  Lymph nodes:   Cervical, supraclavicular, and axillary nodes normal  Neurologic:   CNII-XII intact, normal strength, sensation and reflexes throughout     Labs:  Lab Results  Component Value Date   WBC 4.2 10/23/2017   HGB 13.1 10/23/2017   HCT 38.6 10/23/2017   MCV 79 10/23/2017   PLT 215 10/23/2017    Lab Results  Component Value Date   CREATININE 0.83 10/23/2017   BUN 9 10/23/2017   NA 144 10/23/2017   K 4.1 10/23/2017   CL 103 10/23/2017   CO2 25 10/23/2017    Lab Results  Component Value Date   ALT 5 10/23/2017   AST 11 10/23/2017   ALKPHOS 38 (L) 10/23/2017   BILITOT 0.4 10/23/2017    Lab Results  Component Value Date   TSH 14.770 (H) 10/23/2017     *   Routine gynecologic exam.  Uterine fibroids Hypothyroidism Obesity (mild) History of Vitamin D deficiency IUD threads lost  Plan:   Labs ordered: plans to see new PCP within the month. Will have labs drawn then.  Breast self exam technique reviewed and patient encouraged to  perform self-exam monthly. Contraception: Mirena IUD. Due for removal in 1 year. IUD threads not visible today, but patient still without menses, so likely in place. Will assess next year at time of removal.  Discussed healthy lifestyle modifications.  Mammogram ordered.  Pap smear performed today.  Hypothyroidism typically managed by Endocrinologist, but has not been seen in a while. Due for apponitment.  Fibroids relatively asymptomatic, no need for intervention.  Follow up in 1 year for next annual exam.   [name], MD [facility]

## 2019-09-23 NOTE — Patient Instructions (Addendum)
Preventive Care 40-44 Years Old, Female Preventive care refers to visits with your health care provider and lifestyle choices that can promote health and wellness. This includes:  A yearly physical exam. This may also be called an annual well check.  Regular dental visits and eye exams.  Immunizations.  Screening for certain conditions.  Healthy lifestyle choices, such as eating a healthy diet, getting regular exercise, not using drugs or products that contain nicotine and tobacco, and limiting alcohol use. What can I expect for my preventive care visit? Physical exam Your health care provider will check your:  Height and weight. This may be used to calculate body mass index (BMI), which tells if you are at a healthy weight.  Heart rate and blood pressure.  Skin for abnormal spots. Counseling Your health care provider may ask you questions about your:  Alcohol, tobacco, and drug use.  Emotional well-being.  Home and relationship well-being.  Sexual activity.  Eating habits.  Work and work environment.  Method of birth control.  Menstrual cycle.  Pregnancy history. What immunizations do I need?  Influenza (flu) vaccine  This is recommended every year. Tetanus, diphtheria, and pertussis (Tdap) vaccine  You may need a Td booster every 10 years. Varicella (chickenpox) vaccine  You may need this if you have not been vaccinated. Zoster (shingles) vaccine  You may need this after age 60. Measles, mumps, and rubella (MMR) vaccine  You may need at least one dose of MMR if you were born in 1957 or later. You may also need a second dose. Pneumococcal conjugate (PCV13) vaccine  You may need this if you have certain conditions and were not previously vaccinated. Pneumococcal polysaccharide (PPSV23) vaccine  You may need one or two doses if you smoke cigarettes or if you have certain conditions. Meningococcal conjugate (MenACWY) vaccine  You may need this if you  have certain conditions. Hepatitis A vaccine  You may need this if you have certain conditions or if you travel or work in places where you may be exposed to hepatitis A. Hepatitis B vaccine  You may need this if you have certain conditions or if you travel or work in places where you may be exposed to hepatitis B. Haemophilus influenzae type b (Hib) vaccine  You may need this if you have certain conditions. Human papillomavirus (HPV) vaccine  If recommended by your health care provider, you may need three doses over 6 months. You may receive vaccines as individual doses or as more than one vaccine together in one shot (combination vaccines). Talk with your health care provider about the risks and benefits of combination vaccines. What tests do I need? Blood tests  Lipid and cholesterol levels. These may be checked every 5 years, or more frequently if you are over 44 years old.  Hepatitis C test.  Hepatitis B test. Screening  Lung cancer screening. You may have this screening every year starting at age 44 if you have a 30-pack-year history of smoking and currently smoke or have quit within the past 15 years.  Colorectal cancer screening. All adults should have this screening starting at age 44 and continuing until age 75. Your health care provider may recommend screening at age 44 if you are at increased risk. You will have tests every 1-10 years, depending on your results and the type of screening test.  Diabetes screening. This is done by checking your blood sugar (glucose) after you have not eaten for a while (fasting). You may have this   done every 1-3 years.  Mammogram. This may be done every 1-2 years. Talk with your health care provider about when you should start having regular mammograms. This may depend on whether you have a family history of breast cancer.  BRCA-related cancer screening. This may be done if you have a family history of breast, ovarian, tubal, or peritoneal  cancers.  Pelvic exam and Pap test. This may be done every 3 years starting at age 44. Starting at age 44, this may be done every 5 years if you have a Pap test in combination with an HPV test. Other tests  Sexually transmitted disease (STD) testing.  Bone density scan. This is done to screen for osteoporosis. You may have this scan if you are at high risk for osteoporosis. Follow these instructions at home: Eating and drinking  Eat a diet that includes fresh fruits and vegetables, whole grains, lean protein, and low-fat dairy.  Take vitamin and mineral supplements as recommended by your health care provider.  Do not drink alcohol if: ? Your health care provider tells you not to drink. ? You are pregnant, may be pregnant, or are planning to become pregnant.  If you drink alcohol: ? Limit how much you have to 0-1 drink a day. ? Be aware of how much alcohol is in your drink. In the [place] one drink equals one 12 oz bottle of beer (355 mL), one 5 oz glass of wine (148 mL), or one 1 oz glass of hard liquor (44 mL). Lifestyle  Take daily care of your teeth and gums.  Stay active. Exercise for at least 30 minutes on 5 or more days each week.  Do not use any products that contain nicotine or tobacco, such as cigarettes, e-cigarettes, and chewing tobacco. If you need help quitting, ask your health care provider.  If you are sexually active, practice safe sex. Use a condom or other form of birth control (contraception) in order to prevent pregnancy and STIs (sexually transmitted infections).  If told by your health care provider, take low-dose aspirin daily starting at age 44. What's next?  Visit your health care provider once a year for a well check visit.  Ask your health care provider how often you should have your eyes and teeth checked.  Stay up to date on all vaccines. This information is not intended to replace advice given to you by your health care provider. Make sure you  discuss any questions you have with your health care provider. Document Revised: 12/12/2017 Document Reviewed: 12/12/2017 Elsevier Patient Education  2020 [employer] Breast self-awareness is knowing how your breasts look and feel. Doing breast self-awareness is important. It allows you to catch a breast problem early while it is still small and can be treated. All women should do breast self-awareness, including women who have had breast implants. Tell your doctor if you notice a change in your breasts. What you need:  A mirror.  A well-lit room. How to do a breast self-exam A breast self-exam is one way to learn what is normal for your breasts and to check for changes. To do a breast self-exam: Look for changes  1. Take off all the clothes above your waist. 2. Stand in front of a mirror in a room with good lighting. 3. Put your hands on your hips. 4. Push your hands down. 5. Look at your breasts and nipples in the mirror to see if one breast or nipple looks different  other. Check to see if: ? The shape of one breast is different. ? The size of one breast is different. ? There are wrinkles, dips, and bumps in one breast and not the other. 6. Look at each breast for changes in the skin, such as: ? Redness. ? Scaly areas. 7. Look for changes in your nipples, such as: ? Liquid around the nipples. ? Bleeding. ? Dimpling. ? Redness. ? A change in where the nipples are. Feel for changes  1. Lie on your back on the floor. 2. Feel each breast. To do this, follow these steps: ? Pick a breast to feel. ? Put the arm closest to that breast above your head. ? Use your other arm to feel the nipple area of your breast. Feel the area with the pads of your three middle fingers by making small circles with your fingers. For the first circle, press lightly. For the second circle, press harder. For the third circle, press even harder. ? Keep making circles with  your fingers at the different pressures as you move down your breast. Stop when you feel your ribs. ? Move your fingers a little toward the center of your body. ? Start making circles with your fingers again, this time going up until you reach your collarbone. ? Keep making up-and-down circles until you reach your armpit. Remember to keep using the three pressures. ? Feel the other breast in the same way. 3. Sit or stand in the tub or shower. 4. With soapy water on your skin, feel each breast the same way you did in step 2 when you were lying on the floor. Write down what you find Writing down what you find can help you remember what to tell your doctor. Write down:  What is normal for each breast.  Any changes you find in each breast, including: ? The kind of changes you find. ? Whether you have pain. ? Size and location of any lumps.  When you last had your menstrual period. General tips  Check your breasts every month.  If you are breastfeeding, the best time to check your breasts is after you feed your baby or after you use a breast pump.  If you get menstrual periods, the best time to check your breasts is 5-7 days after your menstrual period is over.  With time, you will become comfortable with the self-exam, and you will begin to know if there are changes in your breasts. Contact a doctor if you:  See a change in the shape or size of your breasts or nipples.  See a change in the skin of your breast or nipples, such as red or scaly skin.  Have fluid coming from your nipples that is not normal.  Find a lump or thick area that was not there before.  Have pain in your breasts.  Have any concerns about your breast health. Summary  Breast self-awareness includes looking for changes in your breasts, as well as feeling for changes within your breasts.  Breast self-awareness should be done in front of a mirror in a well-lit room.  You should check your breasts every month.  If you get menstrual periods, the best time to check your breasts is 5-7 days after your menstrual period is over.  Let your doctor know of any changes you see in your breasts, including changes in size, changes on the skin, pain or tenderness, or fluid from your nipples that is not normal. This information is not   is not intended to replace advice given to you by your health care provider. Make sure you discuss any questions you have with your health care provider. Document Revised: 11/19/2017 Document Reviewed: 11/19/2017 Elsevier Patient Education  [place].

## 2019-09-28 LAB — CYTOLOGY - PAP
Comment: NEGATIVE
Diagnosis: NEGATIVE
High risk HPV: NEGATIVE

## 2019-10-08 ENCOUNTER — Encounter: Payer: 59 | Admitting: Obstetrics and Gynecology

## 2019-10-09 ENCOUNTER — Other Ambulatory Visit: Payer: Self-pay

## 2019-10-09 ENCOUNTER — Encounter: Payer: Self-pay | Admitting: Family Medicine

## 2019-10-09 ENCOUNTER — Ambulatory Visit (INDEPENDENT_AMBULATORY_CARE_PROVIDER_SITE_OTHER): Payer: 59 | Admitting: Family Medicine

## 2019-10-09 VITALS — BP 114/79 | HR 91 | Temp 97.1°F | Resp 16 | Ht 64.0 in | Wt 188.6 lb

## 2019-10-09 DIAGNOSIS — E66811 Obesity, class 1: Secondary | ICD-10-CM

## 2019-10-09 DIAGNOSIS — E039 Hypothyroidism, unspecified: Secondary | ICD-10-CM

## 2019-10-09 DIAGNOSIS — Z1159 Encounter for screening for other viral diseases: Secondary | ICD-10-CM | POA: Diagnosis not present

## 2019-10-09 DIAGNOSIS — E559 Vitamin D deficiency, unspecified: Secondary | ICD-10-CM

## 2019-10-09 DIAGNOSIS — D573 Sickle-cell trait: Secondary | ICD-10-CM

## 2019-10-09 DIAGNOSIS — D259 Leiomyoma of uterus, unspecified: Secondary | ICD-10-CM

## 2019-10-09 DIAGNOSIS — R5383 Other fatigue: Secondary | ICD-10-CM | POA: Diagnosis not present

## 2019-10-09 DIAGNOSIS — Z6832 Body mass index (BMI) 32.0-32.9, adult: Secondary | ICD-10-CM

## 2019-10-09 DIAGNOSIS — E669 Obesity, unspecified: Secondary | ICD-10-CM

## 2019-10-09 NOTE — Assessment & Plan Note (Signed)
Ongoing for about 2 months Given her reported history of vitamin D deficiency and some improvement on vitamin D supplementation, suspect that vitamin D deficiency is the cause of her fatigue Encouraged her to continue her high-dose vitamin D supplementation Would recheck vitamin D levels in about 3 months We will also check CMP and CBC for any other possible underlying etiology Return precautions discussed

## 2019-10-09 NOTE — Assessment & Plan Note (Signed)
Followed by Dr. Ronnald Collum Reports recent normal TSH No changes to medications today ROI sent for records

## 2019-10-09 NOTE — Progress Notes (Signed)
New patient visit   Patient: Emily Howard   DOB: 1976-02-27   44 y.o. Female  MRN: [id number] Visit Date: 10/09/2019  I,Sulibeya S Dimas,acting as a scribe for Lavon Paganini, MD.,have documented all relevant documentation on the behalf of Lavon Paganini, MD,as directed by  Lavon Paganini, MD while in the presence of Lavon Paganini, MD. Today's healthcare provider: Lavon Paganini, MD   Chief Complaint  Patient presents with  . New Patient (Initial Visit)   Subjective    Emily Howard is a 44 y.o. female who presents today as a new patient to establish care.  HPI  Patient was a patient at [facility] GYN-Dr. Marcelline Mates FB Hipolito Bayley, MD for thyroid  Reports fatigue x2 months.  Vitamin D level and TSH were recently checked by Dr. Truddie Coco.  We will send ROI for these records, but patient reports that vitamin D level was low and she was started on high-dose weekly vitamin D.  She also reports her TSH level was normal and her Synthroid and Cytomel dose were unchanged.  Mirena IUD in place.  Managed by GYN for annual gynecologic exams, cervical cancer screening, uterine fibroid, and contraceptive.  Reports she has regular, light monthly periods.  Her sister has sickle cell anemia and patient has sickle cell trait.  She has had issues with intermittent anemia in the past.  Past Medical History:  Diagnosis Date  . Fibroid, uterine   . GERD (gastroesophageal reflux disease)   . H/O hemorrhoidectomy   . Hypotension   . Hypothyroidism   . Insomnia   . Myalgia   . Neutropenia ([place])   . Sickle cell trait ([place])   . Vitamin D deficiency    Past Surgical History:  Procedure Laterality Date  . HEMORRHOID SURGERY    . THYROIDECTOMY  2010   Family Status  Relation Name Status  . Mother  Alive  . Father  Deceased at age 61  . MGM  Deceased  . MGF  Deceased  . Sister  Alive  . Brother  Alive  . Daughter  Alive  . Son  Alive  . Sister  Deceased at age 27    . Son  Alive  . Neg Hx  (Not Specified)   Family History  Problem Relation Age of Onset  . Hypertension Mother   . Arthritis Mother   . Pancreatic cancer Father   . Stroke Maternal Grandmother   . Stroke Maternal Grandfather   . Sickle cell anemia Sister   . Sickle cell trait Son   . Sickle cell trait Son   . Breast cancer Neg Hx   . Colon cancer Neg Hx    Social History   Socioeconomic History  . Marital status: Married    Spouse name: Not on file  . Number of children: 3  . Years of education: Not on file  . Highest education level: Not on file  Occupational History  . Occupation: Hotel manager    Comment: [employer]  Tobacco Use  . Smoking status: Never Smoker  . Smokeless tobacco: Never Used  Vaping Use  . Vaping Use: Never used  Substance and Sexual Activity  . Alcohol use: Yes    Alcohol/week: 1.0 - 2.0 standard drink    Types: 1 - 2 Glasses of wine per week  . Drug use: No  . Sexual activity: Yes    Partners: Male    Birth control/protection: I.U.D.    Comment: Mirena   Other Topics Concern  .  Not on file  Social History Narrative  . Not on file   Social Determinants of Health   Financial Resource Strain:   . Difficulty of Paying Living Expenses:   Food Insecurity:   . Worried About Charity fundraiser in the Last Year:   . Arboriculturist in the Last Year:   Transportation Needs:   . Film/video editor (Medical):   Marland Kitchen Lack of Transportation (Non-Medical):   Physical Activity:   . Days of Exercise per Week:   . Minutes of Exercise per Session:   Stress:   . Feeling of Stress :   Social Connections:   . Frequency of Communication with Friends and Family:   . Frequency of Social Gatherings with Friends and Family:   . Attends Religious Services:   . Active Member of Clubs or Organizations:   . Attends Archivist Meetings:   Marland Kitchen Marital Status:    Outpatient Medications Prior to Visit  Medication Sig  . calcitRIOL (ROCALTROL) 0.5  MCG capsule Take 0.5 mcg by mouth daily.  Marland Kitchen levonorgestrel (MIRENA, 52 MG,) 20 MCG/24HR IUD 1 Intra Uterine Device (1 each total) by Intrauterine route once.  Marland Kitchen levothyroxine (SYNTHROID) 125 MCG tablet Take 1 tablet (125 mcg total) by mouth daily before breakfast.  . liothyronine (CYTOMEL) 5 MCG tablet Take 5 mcg by mouth daily.  . Multiple Vitamin (MULTI-VITAMINS PO) Take by mouth.  . Vitamin D, Ergocalciferol, (DRISDOL) 1.25 MG (50000 UNIT) CAPS capsule Take 50,000 Units by mouth once a week.  . [DISCONTINUED] lidocaine (XYLOCAINE) 2 % jelly Apply 1 application topically as needed.   No facility-administered medications prior to visit.   Allergies  Allergen Reactions  . Astramorph [Morphine] Itching    Itching for a week  . Chloroquine Hives and Itching    Immunization History  Administered Date(s) Administered  . Tdap 01/26/2015    Health Maintenance  Topic Date Due  . Hepatitis C Screening  Never done  . COVID-19 Vaccine (1) Never done  . INFLUENZA VACCINE  11/15/2019  . PAP SMEAR-Modifier  09/23/2022  . TETANUS/TDAP  01/25/2025  . HIV Screening  Completed    No care team member to display  Review of Systems  Constitutional: Positive for fatigue.  HENT: Negative.   Eyes: Negative.   Respiratory: Negative.   Cardiovascular: Positive for palpitations.  Gastrointestinal: Negative.   Endocrine: Negative.   Genitourinary: Positive for genital sores.  Musculoskeletal: Negative.   Skin: Negative.   Allergic/Immunologic: Negative.   Neurological: Positive for headaches.  Hematological: Negative.   Psychiatric/Behavioral: Negative.       Objective    BP 114/79 (BP Location: Left Arm, Patient Position: Sitting, Cuff Size: Normal)   Pulse 91   Temp (!) 97.1 F (36.2 C) (Temporal)   Resp 16   Ht 5\' 4"  (1.626 m)   Wt 188 lb 9.6 oz (85.5 kg)   LMP 09/27/2019 (Exact Date)   BMI 32.37 kg/m  Physical Exam Vitals reviewed.  Constitutional:      General: She is not  in acute distress.    Appearance: Normal appearance. She is well-developed. She is not diaphoretic.  HENT:     Head: Normocephalic and atraumatic.     Right Ear: Tympanic membrane, ear canal and external ear normal.     Left Ear: Tympanic membrane, ear canal and external ear normal.  Eyes:     General: No scleral icterus.    Conjunctiva/sclera: Conjunctivae normal.  Pupils: Pupils are equal, round, and reactive to light.  Neck:     Thyroid: No thyromegaly.     Comments: Surgically absent thyroid Cardiovascular:     Rate and Rhythm: Normal rate and regular rhythm.     Pulses: Normal pulses.     Heart sounds: Normal heart sounds. No murmur heard.   Pulmonary:     Effort: Pulmonary effort is normal. No respiratory distress.     Breath sounds: Normal breath sounds. No wheezing or rales.  Abdominal:     General: There is no distension.     Palpations: Abdomen is soft.     Tenderness: There is no abdominal tenderness.  Musculoskeletal:        General: No deformity.     Cervical back: Neck supple.     Right lower leg: No edema.     Left lower leg: No edema.  Lymphadenopathy:     Cervical: No cervical adenopathy.  Skin:    General: Skin is warm and dry.     Findings: No rash.  Neurological:     Mental Status: She is alert and oriented to person, place, and time. Mental status is at baseline.     Sensory: No sensory deficit.     Motor: No weakness.     Gait: Gait normal.  Psychiatric:        Mood and Affect: Mood normal.        Behavior: Behavior normal.        Thought Content: Thought content normal.     Depression Screen PHQ 2/9 Scores 10/09/2019  PHQ - 2 Score 0  PHQ- 9 Score 3   No results found for any visits on 10/09/19.  Assessment & Plan      Problem List Items Addressed This Visit      Endocrine   Hypothyroidism - Primary (Chronic)    Followed by Dr. Ronnald Collum Reports recent normal TSH No changes to medications today ROI sent for records         Genitourinary   Uterine fibroid    Chronic and stable No heavy menstrual bleeding Followed by GYN        Other   Obesity    Discussed importance of healthy weight management Discussed diet and exercise       Fatigue    Ongoing for about 2 months Given her reported history of vitamin D deficiency and some improvement on vitamin D supplementation, suspect that vitamin D deficiency is the cause of her fatigue Encouraged her to continue her high-dose vitamin D supplementation Would recheck vitamin D levels in about 3 months We will also check CMP and CBC for any other possible underlying etiology Return precautions discussed      Relevant Orders   Comprehensive metabolic panel   CBC with Differential/Platelet   Vitamin D deficiency    As above, suspect that this is the cause of her fatigue Continue high-dose weekly supplement Recheck level in 3 months      Sickle cell trait ([place])    Asymptomatic Monitor annual CBC       Other Visit Diagnoses    Need for hepatitis C screening test       Relevant Orders   Hepatitis C antibody       Return in about 6 months (around 04/09/2020) for CPE.     I, Lavon Paganini, MD, have reviewed all documentation for this visit. The documentation on 10/09/19 for the exam, diagnosis, procedures, and orders are all  accurate and complete.   Kamauri Kathol, Dionne Bucy, MD, MPH [facility]

## 2019-10-09 NOTE — Assessment & Plan Note (Signed)
As above, suspect that this is the cause of her fatigue Continue high-dose weekly supplement Recheck level in 3 months

## 2019-10-09 NOTE — Assessment & Plan Note (Signed)
Chronic and stable No heavy menstrual bleeding Followed by GYN

## 2019-10-09 NOTE — Assessment & Plan Note (Signed)
Discussed importance of healthy weight management Discussed diet and exercise  

## 2019-10-09 NOTE — Assessment & Plan Note (Signed)
Asymptomatic Monitor annual CBC

## 2019-11-23 ENCOUNTER — Encounter: Payer: Self-pay | Admitting: Family Medicine

## 2019-11-23 NOTE — Progress Notes (Signed)
Medical records received for patient from Dr. Ronnald Collum.  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  She is status post thyroidectomy followed by radioactive iodine 131 ablation therapy.  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  She underwent 5-year post therapy iodine-131 scan on 01/15/2014 that showed no evidence of cancer with insignificant thyroid bed activity uptake and is now considered cancer free.  She needs yearly thyroid panel and neck exam.  Her thyroid cancer was initially discovered in 2008 and surgery was delayed until 07/16/2007.  Per records, she is on a combination of Synthroid and Cytomel because of significant resistant pituitary gland suppression of thyroxine seen with higher level of TSH than desired for thyroid cancer management.  It was also recommended that she have DEXA scan in 2020 given her hypocalcemia and low alk phos. DEXA scan was completed 01/10/2018 that showed T score of -0.3 of the lumbar spine, +0.2 of the total hip, and +0.4 of the femoral neck.  Overall normal.  We will abstract results.  Will also abstract recent labs.

## 2019-11-25 ENCOUNTER — Encounter: Payer: Self-pay | Admitting: Family Medicine

## 2019-11-25 LAB — CBC WITH DIFFERENTIAL/PLATELET
Basophils Absolute: 0 10*3/uL (ref 0.0–0.2)
Basos: 1 %
EOS (ABSOLUTE): 0.2 10*3/uL (ref 0.0–0.4)
Eos: 4 %
Hematocrit: 37.8 % (ref 34.0–46.6)
Hemoglobin: 12.8 g/dL (ref 11.1–15.9)
Immature Grans (Abs): 0 10*3/uL (ref 0.0–0.1)
Immature Granulocytes: 0 %
Lymphocytes Absolute: 1.7 10*3/uL (ref 0.7–3.1)
Lymphs: 37 %
MCH: 26 pg — ABNORMAL LOW (ref 26.6–33.0)
MCHC: 33.9 g/dL (ref 31.5–35.7)
MCV: 77 fL — ABNORMAL LOW (ref 79–97)
Monocytes Absolute: 0.4 10*3/uL (ref 0.1–0.9)
Monocytes: 9 %
Neutrophils Absolute: 2.3 10*3/uL (ref 1.4–7.0)
Neutrophils: 49 %
Platelets: 226 10*3/uL (ref 150–450)
RBC: 4.93 x10E6/uL (ref 3.77–5.28)
RDW: 13 % (ref 11.7–15.4)
WBC: 4.7 10*3/uL (ref 3.4–10.8)

## 2019-11-25 LAB — COMPREHENSIVE METABOLIC PANEL
ALT: 27 IU/L (ref 0–32)
AST: 23 IU/L (ref 0–40)
Albumin/Globulin Ratio: 1.8 (ref 1.2–2.2)
Albumin: 4.2 g/dL (ref 3.8–4.8)
Alkaline Phosphatase: 38 IU/L — ABNORMAL LOW (ref 48–121)
BUN/Creatinine Ratio: 15 (ref 9–23)
BUN: 11 mg/dL (ref 6–24)
Bilirubin Total: 0.3 mg/dL (ref 0.0–1.2)
CO2: 22 mmol/L (ref 20–29)
Calcium: 8.9 mg/dL (ref 8.7–10.2)
Chloride: 103 mmol/L (ref 96–106)
Creatinine, Ser: 0.71 mg/dL (ref 0.57–1.00)
GFR calc Af Amer: 120 mL/min/{1.73_m2} (ref >59)
GFR calc non Af Amer: 104 mL/min/{1.73_m2} (ref >59)
Globulin, Total: 2.3 g/dL (ref 1.5–4.5)
Glucose: 76 mg/dL (ref 65–99)
Potassium: 4.1 mmol/L (ref 3.5–5.2)
Sodium: 138 mmol/L (ref 134–144)
Total Protein: 6.5 g/dL (ref 6.0–8.5)

## 2019-11-25 LAB — HEPATITIS C ANTIBODY: Hep C Virus Ab: <0.1 s/co ratio (ref 0.0–0.9)

## 2019-11-30 ENCOUNTER — Telehealth: Payer: Self-pay

## 2019-11-30 NOTE — Telephone Encounter (Signed)
Result note read to patient, verbalizes understanding. Pt would like a copy of labs mailed to her home address, verified. Also gave patient instructions to sign up for MyChart.

## 2019-11-30 NOTE — Telephone Encounter (Signed)
-----   Message from Virginia Crews, MD sent at 11/27/2019 11:47 AM EDT ----- Normal/stable labs

## 2019-11-30 NOTE — Telephone Encounter (Signed)
LMTCB 11/30/2019.  PEC please advise pt of lab results below.   Thanks,   -Mickel Baas

## 2020-03-25 ENCOUNTER — Other Ambulatory Visit: Payer: Self-pay

## 2020-03-25 ENCOUNTER — Ambulatory Visit
Admission: RE | Admit: 2020-03-25 | Discharge: 2020-03-25 | Disposition: A | Discharge status: Home / Self Care | Payer: 59 | Source: Ambulatory Visit | Attending: Obstetrics and Gynecology | Admitting: Obstetrics and Gynecology

## 2020-03-25 DIAGNOSIS — Z1231 Encounter for screening mammogram for malignant neoplasm of breast: Secondary | ICD-10-CM | POA: Diagnosis not present

## 2020-04-04 ENCOUNTER — Encounter: Payer: 59 | Admitting: Family Medicine

## 2020-04-27 ENCOUNTER — Encounter: Payer: Self-pay | Admitting: Obstetrics and Gynecology

## 2020-04-27 ENCOUNTER — Other Ambulatory Visit: Payer: Self-pay

## 2020-04-27 ENCOUNTER — Encounter: Payer: 59 | Admitting: Obstetrics and Gynecology

## 2020-04-27 NOTE — Progress Notes (Signed)
Patient scheduled in error. Not having any issues. IUD for removal in August. Will cancel appointment.

## 2020-04-27 NOTE — Progress Notes (Signed)
Pt present for 6 month follow up.

## 2020-09-27 ENCOUNTER — Encounter: Payer: 59 | Admitting: Obstetrics and Gynecology

## 2020-10-06 ENCOUNTER — Encounter: Payer: 59 | Admitting: Obstetrics and Gynecology

## 2020-10-13 ENCOUNTER — Other Ambulatory Visit: Payer: Self-pay

## 2020-10-13 ENCOUNTER — Encounter: Payer: Self-pay | Admitting: Obstetrics and Gynecology

## 2020-10-13 ENCOUNTER — Ambulatory Visit (INDEPENDENT_AMBULATORY_CARE_PROVIDER_SITE_OTHER): Payer: 59 | Admitting: Obstetrics and Gynecology

## 2020-10-13 VITALS — BP 124/76 | HR 79 | Ht 64.0 in | Wt 186.2 lb

## 2020-10-13 DIAGNOSIS — Z01419 Encounter for gynecological examination (general) (routine) without abnormal findings: Secondary | ICD-10-CM

## 2020-10-13 DIAGNOSIS — D259 Leiomyoma of uterus, unspecified: Secondary | ICD-10-CM

## 2020-10-13 DIAGNOSIS — T8332XA Displacement of intrauterine contraceptive device, initial encounter: Secondary | ICD-10-CM

## 2020-10-13 DIAGNOSIS — E039 Hypothyroidism, unspecified: Secondary | ICD-10-CM

## 2020-10-13 DIAGNOSIS — Z131 Encounter for screening for diabetes mellitus: Secondary | ICD-10-CM

## 2020-10-13 DIAGNOSIS — Z1231 Encounter for screening mammogram for malignant neoplasm of breast: Secondary | ICD-10-CM

## 2020-10-13 DIAGNOSIS — E669 Obesity, unspecified: Secondary | ICD-10-CM

## 2020-10-13 NOTE — Patient Instructions (Signed)
Breast Self-Awareness Breast self-awareness is knowing how your breasts look and feel. Doing breast self-awareness is important. It allows you to catch a breast problem early while it is still small and can be treated. All women should do breast self-awareness, including women who have had breast implants. Tell your doctorif you notice a change in your breasts. What you need: A mirror. A well-lit room. How to do a breast self-exam A breast self-exam is one way to learn what is normal for your breasts and tocheck for changes. To do a breast self-exam: Look for changes  Take off all the clothes above your waist. Stand in front of a mirror in a room with good lighting. Put your hands on your hips. Push your hands down. Look at your breasts and nipples in the mirror to see if one breast or nipple looks different from the other. Check to see if: The shape of one breast is different. The size of one breast is different. There are wrinkles, dips, and bumps in one breast and not the other. Look at each breast for changes in the skin, such as: Redness. Scaly areas. Look for changes in your nipples, such as: Liquid around the nipples. Bleeding. Dimpling. Redness. A change in where the nipples are.  Feel for changes  Lie on your back on the floor. Feel each breast. To do this, follow these steps: Pick a breast to feel. Put the arm closest to that breast above your head. Use your other arm to feel the nipple area of your breast. Feel the area with the pads of your three middle fingers by making small circles with your fingers. For the first circle, press lightly. For the second circle, press harder. For the third circle, press even harder. Keep making circles with your fingers at the different pressures as you move down your breast. Stop when you feel your ribs. Move your fingers a little toward the center of your body. Start making circles with your fingers again, this time going up until  you reach your collarbone. Keep making up-and-down circles until you reach your armpit. Remember to keep using the three pressures. Feel the other breast in the same way. Sit or stand in the tub or shower. With soapy water on your skin, feel each breast the same way you did in step 2 when you were lying on the floor.  Write down what you find Writing down what you find can help you remember what to tell your doctor. Write down: What is normal for each breast. Any changes you find in each breast, including: The kind of changes you find. Whether you have pain. Size and location of any lumps. When you last had your menstrual period. General tips Check your breasts every month. If you are breastfeeding, the best time to check your breasts is after you feed your baby or after you use a breast pump. If you get menstrual periods, the best time to check your breasts is 5-7 days after your menstrual period is over. With time, you will become comfortable with the self-exam, and you will begin to know if there are changes in your breasts. Contact a doctor if you: See a change in the shape or size of your breasts or nipples. See a change in the skin of your breast or nipples, such as red or scaly skin. Have fluid coming from your nipples that is not normal. Find a lump or thick area that was not there before. Have pain in   your breasts. Have any concerns about your breast health. Summary Breast self-awareness includes looking for changes in your breasts, as well as feeling for changes within your breasts. Breast self-awareness should be done in front of a mirror in a well-lit room. You should check your breasts every month. If you get menstrual periods, the best time to check your breasts is 5-7 days after your menstrual period is over. Let your doctor know of any changes you see in your breasts, including changes in size, changes on the skin, pain or tenderness, or fluid from your nipples that is  not normal. This information is not intended to replace advice given to you by your health care provider. Make sure you discuss any questions you have with your healthcare provider. Document Revised: 11/19/2017 Document Reviewed: 11/19/2017 Elsevier Patient Education  2022 Elsevier Inc.     Preventive Care 27-71 Years Old, Female Preventive care refers to lifestyle choices and visits with your health care provider that can promote health and wellness. This includes: A yearly physical exam. This is also called an annual wellness visit. Regular dental and eye exams. Immunizations. Screening for certain conditions. Healthy lifestyle choices, such as: Eating a healthy diet. Getting regular exercise. Not using drugs or products that contain nicotine and tobacco. Limiting alcohol use. What can I expect for my preventive care visit? Physical exam Your health care provider will check your: Height and weight. These may be used to calculate your BMI (body mass index). BMI is a measurement that tells if you are at a healthy weight. Heart rate and blood pressure. Body temperature. Skin for abnormal spots. Counseling Your health care provider may ask you questions about your: Past medical problems. Family's medical history. Alcohol, tobacco, and drug use. Emotional well-being. Home life and relationship well-being. Sexual activity. Diet, exercise, and sleep habits. Work and work Statistician. Access to firearms. Method of birth control. Menstrual cycle. Pregnancy history. What immunizations do I need?  Vaccines are usually given at various ages, according to a schedule. Your health care provider will recommend vaccines for you based on your age, medicalhistory, and lifestyle or other factors, such as travel or where you work. What tests do I need? Blood tests Lipid and cholesterol levels. These may be checked every 5 years, or more often if you are over 32 years old. Hepatitis C  test. Hepatitis B test. Screening Lung cancer screening. You may have this screening every year starting at age 53 if you have a 30-pack-year history of smoking and currently smoke or have quit within the past 15 years. Colorectal cancer screening. All adults should have this screening starting at age 19 and continuing until age 15. Your health care provider may recommend screening at age 46 if you are at increased risk. You will have tests every 1-10 years, depending on your results and the type of screening test. Diabetes screening. This is done by checking your blood sugar (glucose) after you have not eaten for a while (fasting). You may have this done every 1-3 years. Mammogram. This may be done every 1-2 years. Talk with your health care provider about when you should start having regular mammograms. This may depend on whether you have a family history of breast cancer. BRCA-related cancer screening. This may be done if you have a family history of breast, ovarian, tubal, or peritoneal cancers. Pelvic exam and Pap test. This may be done every 3 years starting at age 42. Starting at age 15, this may be done every  5 years if you have a Pap test in combination with an HPV test. Other tests STD (sexually transmitted disease) testing, if you are at risk. Bone density scan. This is done to screen for osteoporosis. You may have this scan if you are at high risk for osteoporosis. Talk with your health care provider about your test results, treatment options,and if necessary, the need for more tests. Follow these instructions at home: Eating and drinking  Eat a diet that includes fresh fruits and vegetables, whole grains, lean protein, and low-fat dairy products. Take vitamin and mineral supplements as recommended by your health care provider. Do not drink alcohol if: Your health care provider tells you not to drink. You are pregnant, may be pregnant, or are planning to become pregnant. If  you drink alcohol: Limit how much you have to 0-1 drink a day. Be aware of how much alcohol is in your drink. In the [place] one drink equals one 12 oz bottle of beer (355 mL), one 5 oz glass of wine (148 mL), or one 1 oz glass of hard liquor (44 mL).  Lifestyle Take daily care of your teeth and gums. Brush your teeth every morning and night with fluoride toothpaste. Floss one time each day. Stay active. Exercise for at least 30 minutes 5 or more days each week. Do not use any products that contain nicotine or tobacco, such as cigarettes, e-cigarettes, and chewing tobacco. If you need help quitting, ask your health care provider. Do not use drugs. If you are sexually active, practice safe sex. Use a condom or other form of protection to prevent STIs (sexually transmitted infections). If you do not wish to become pregnant, use a form of birth control. If you plan to become pregnant, see your health care provider for a prepregnancy visit. If told by your health care provider, take low-dose aspirin daily starting at age 50. Find healthy ways to cope with stress, such as: Meditation, yoga, or listening to music. Journaling. Talking to a trusted person. Spending time with friends and family. Safety Always wear your seat belt while driving or riding in a vehicle. Do not drive: If you have been drinking alcohol. Do not ride with someone who has been drinking. When you are tired or distracted. While texting. Wear a helmet and other protective equipment during sports activities. If you have firearms in your house, make sure you follow all gun safety procedures. What's next? Visit your health care provider once a year for an annual wellness visit. Ask your health care provider how often you should have your eyes and teeth checked. Stay up to date on all vaccines. This information is not intended to replace advice given to you by your health care provider. Make sure you discuss any questions you  have with your healthcare provider. Document Revised: 01/05/2020 Document Reviewed: 12/12/2017 Elsevier Patient Education  2022 Elsevier Inc.  

## 2020-10-13 NOTE — Progress Notes (Signed)
GYNECOLOGY ANNUAL PHYSICAL EXAM PROGRESS NOTE  Subjective:    Emily Howard is a 45 y.o. G31P3003 female with a h/o hypothyroidism and uterine fibroids who presents for an annual exam.  The patient is sexually active.  The patient wears seatbelts: yes. The patient participates in regular exercise: no. Has the patient ever been transfused or tattooed?: no. The patient reports that there is not domestic violence in her life.    The patient desires to mention the following today: Emily Howard does report that she recently started on a new weight loss program using the [employer].  She initiated 3 days ago. Is already feeling a difference. Has lost 4 lbs already.  Reports that she has had a lingering cough for 2 weeks, productive with mucus.  Is using Mucinex. Notes that her daughter had a bad cold a week or so prior.  She was tested for COVID and was negative.   Gynecologic History Menarche age: 45 Patient's last menstrual period was 09/30/2020 (exact date). Contraception: Mirena IUD (inserted 05/2015).  History of STI's:  Denies  Last Pap: 09/23/2019. Results were: normal.  Denies h/o abnormal pap smears. Last mammogram: 03/25/2020.  Results were: normal.  Last colonoscopy: patient has never had one.      10/13/2020   1518   Pregnancy Intention Screening   Does the patient want to become pregnant in the next year? No  Does the patient's partner want to become pregnant in the next year? No  Would the patient like to discuss contraceptive options today? No  Contraception Wrap Up   Current Method IUD or IUS  End Method IUD or IUS  Contraception Counseling Provided No    The pregnancy intention screening data noted above was reviewed. Potential methods of contraception were not discussed. The patient elected to proceed with IUD or IUS.      OB History  Gravida Para Term Preterm AB Living  3 3 3  0 0 3  SAB IAB Ectopic Multiple Live Births  0 0 0 0 3    # Outcome Date GA Lbr  Len/2nd Weight Sex Delivery Anes PTL Lv  3 Term 03/20/15 [redacted]w[redacted]d  7 lb 12.3 oz (3.525 kg) F Vag-Spont EPI N LIV     Complications: Postpartum hemorrhage     Apgar1: 8  Apgar5: 9  2 Term 2007   10 lb (4.536 kg) M Vag-Spont   LIV  1 Term 2005   7 lb 6 oz (3.345 kg) M Vag-Spont   LIV    Obstetric Comments  Fibroid uterus in pregnancy    Past Medical History:  Diagnosis Date   Fibroid, uterine    GERD (gastroesophageal reflux disease)    H/O hemorrhoidectomy    Hypotension    Hypothyroidism    Insomnia    Myalgia    Neutropenia (HCC)    Sickle cell trait ([place])    Vitamin D deficiency     Past Surgical History:  Procedure Laterality Date   HEMORRHOID SURGERY     THYROIDECTOMY  2010    Family History  Problem Relation Age of Onset   Hypertension Mother    Arthritis Mother    Pancreatic cancer Father    Stroke Maternal Grandmother    Stroke Maternal Grandfather    Sickle cell anemia Sister    Sickle cell trait Son    Sickle cell trait Son    Breast cancer Neg Hx    Colon cancer Neg Hx     Social  History   Socioeconomic History   Marital status: Married    Spouse name: Not on file   Number of children: 3   Years of education: Not on file   Highest education level: Not on file  Occupational History   Occupation: Hotel manager    Comment: [employer]  Tobacco Use   Smoking status: Never   Smokeless tobacco: Never  Vaping Use   Vaping Use: Never used  Substance and Sexual Activity   Alcohol use: Yes    Alcohol/week: 1.0 - 2.0 standard drink    Types: 1 - 2 Glasses of wine per week   Drug use: No   Sexual activity: Yes    Partners: Male    Birth control/protection: I.U.D.    Comment: Mirena   Other Topics Concern   Not on file  Social History Narrative   Not on file   Social Determinants of Health   Financial Resource Strain: Not on file  Food Insecurity: Not on file  Transportation Needs: Not on file  Physical Activity: Not on file  Stress: Not on file   Social Connections: Not on file  Intimate Partner Violence: Not on file    Current Outpatient Medications on File Prior to Visit  Medication Sig Dispense Refill   calcitRIOL (ROCALTROL) 0.5 MCG capsule Take 0.5 mcg by mouth daily.     levonorgestrel (MIRENA, 52 MG,) 20 MCG/24HR IUD 1 Intra Uterine Device (1 each total) by Intrauterine route once. 1 each 0   levothyroxine (SYNTHROID) 137 MCG tablet Take 137 mcg by mouth daily before breakfast.     liothyronine (CYTOMEL) 5 MCG tablet Take 5 mcg by mouth daily.     Multiple Vitamin (MULTI-VITAMINS PO) Take by mouth.     Vitamin D, Ergocalciferol, (DRISDOL) 1.25 MG (50000 UNIT) CAPS capsule Take 50,000 Units by mouth once a week.     No current facility-administered medications on file prior to visit.    Allergies  Allergen Reactions   Astramorph [Morphine] Itching    Itching for a week   Chloroquine Hives and Itching     Review of Systems Constitutional: negative for chills, fatigue, fevers and sweats Eyes: negative for irritation, redness and visual disturbance Ears, nose, mouth, throat, and face: negative for hearing loss, nasal congestion, snoring and tinnitus Respiratory: negative for asthma.  Positive for productive cough x 2 weeks Cardiovascular: negative for chest pain, dyspnea, exertional chest pressure/discomfort, irregular heart beat, palpitations and syncope Gastrointestinal: negative for abdominal pain, change in bowel habits, nausea and vomiting Genitourinary: negative for abnormal menstrual periods, genital lesions, sexual problems and vaginal discharge, dysuria and urinary incontinence Integument/breast: negative for breast lump, breast tenderness and nipple discharge Hematologic/lymphatic: negative for bleeding and easy bruising Musculoskeletal:negative for back pain and muscle weakness Neurological: negative for dizziness, headaches, vertigo and weakness Endocrine: negative for diabetic symptoms including  polydipsia, polyuria and skin dryness Allergic/Immunologic: negative for hay fever and urticaria       Objective:  Blood pressure 124/76, pulse 79, height 5\' 4"  (1.626 m), weight 186 lb 3.2 oz (84.5 kg), last menstrual period 09/30/2020. Body mass index is 31.96 kg/m.   General Appearance:    Alert, cooperative, no distress, appears stated age, mild obesity  Head:    Normocephalic, without obvious abnormality, atraumatic  Eyes:    PERRL, conjunctiva/corneas clear, EOM's intact, both eyes  Ears:    Normal external ear canals, both ears  Nose:   Nares normal, septum midline, mucosa normal, no drainage or sinus  tenderness  Throat:   Lips, mucosa, and tongue normal; teeth and gums normal  Neck:   Supple, symmetrical, trachea midline, no adenopathy; thyroid: no enlargement/tenderness/nodules; no carotid bruit or JVD  Back:     Symmetric, no curvature, ROM normal, no CVA tenderness  Lungs:     Clear to auscultation bilaterally, respirations unlabored  Chest Wall:    No tenderness or deformity   Heart:    Regular rate and rhythm, S1 and S2 normal, no murmur, rub or gallop  Breast Exam:    No tenderness, masses, or nipple abnormality  Abdomen:     Soft, non-tender, bowel sounds actillve all four quadrants, no masses, no organomegaly.    Genitalia:    Pelvic:external genitalia normal, vagina without lesions, discharge, or tenderness, rectovaginal septum  normal. Cervix normal in appearance, no cervical motion tenderness, IUD visualized.  No adnexal masses or tenderness.  Uterus normal size with irregular contour, mobile, nontender.  Rectal:    Normal external sphincter.  No hemorrhoids appreciated. Internal exam not done.   Extremities:   Extremities normal, atraumatic, no cyanosis or edema  Pulses:   2+ and symmetric all extremities  Skin:   Skin color, texture, turgor normal, no rashes or lesions  Lymph nodes:   Cervical, supraclavicular, and axillary nodes normal  Neurologic:   CNII-XII  intact, normal strength, sensation and reflexes throughout     Labs:  Lab Results  Component Value Date   WBC 4.7 11/24/2019   HGB 12.8 11/24/2019   HCT 37.8 11/24/2019   MCV 77 (L) 11/24/2019   PLT 226 11/24/2019    Lab Results  Component Value Date   CREATININE 0.71 11/24/2019   BUN 11 11/24/2019   NA 138 11/24/2019   K 4.1 11/24/2019   CL 103 11/24/2019   CO2 22 11/24/2019    Lab Results  Component Value Date   ALT 27 11/24/2019   AST 23 11/24/2019   ALKPHOS 38 (L) 11/24/2019   BILITOT 0.3 11/24/2019    Lab Results  Component Value Date   TSH 9.70 (A) 03/30/2019     *   Routine gynecologic exam. Uterine fibroids Hypothyroidism Obesity (mild) IUD threads lost  Plan:   Labs: see orders  Breast self exam technique reviewed and patient encouraged to perform self-exam monthly. Contraception: Mirena IUD. Discussed that IUD can now be left in place for total of 7 years instead of 5 years. Can leave in place for 2 additional years. . IUD threads not visible today, without symptoms.  Discussed healthy lifestyle modifications.  Mammogram up to date, order placed for December..  Pap smear up to date.  Hypothyroidism typically managed by Endocrinologist Fibroids relatively asymptomatic, no need for intervention.  COVID vaccination status: has not received, is eligible.  Follow up in 1 year for next annual exam.   [name], MD [facility]

## 2020-10-13 NOTE — Progress Notes (Signed)
Pt present for annual exam. Pt stated that she has had a cough for about 2 weeks.

## 2020-10-14 DIAGNOSIS — R7303 Prediabetes: Secondary | ICD-10-CM | POA: Insufficient documentation

## 2020-10-14 LAB — TSH: TSH: 1.09 u[IU]/mL (ref 0.450–4.500)

## 2020-10-14 LAB — COMPREHENSIVE METABOLIC PANEL
ALT: 19 IU/L (ref 0–32)
AST: 19 IU/L (ref 0–40)
Albumin/Globulin Ratio: 1.7 (ref 1.2–2.2)
Albumin: 4.4 g/dL (ref 3.8–4.8)
Alkaline Phosphatase: 38 IU/L — ABNORMAL LOW (ref 44–121)
BUN/Creatinine Ratio: 20 (ref 9–23)
BUN: 15 mg/dL (ref 6–24)
Bilirubin Total: 0.5 mg/dL (ref 0.0–1.2)
CO2: 23 mmol/L (ref 20–29)
Calcium: 9.4 mg/dL (ref 8.7–10.2)
Chloride: 99 mmol/L (ref 96–106)
Creatinine, Ser: 0.75 mg/dL (ref 0.57–1.00)
Globulin, Total: 2.6 g/dL (ref 1.5–4.5)
Glucose: 72 mg/dL (ref 65–99)
Potassium: 4.2 mmol/L (ref 3.5–5.2)
Sodium: 136 mmol/L (ref 134–144)
Total Protein: 7 g/dL (ref 6.0–8.5)
eGFR: 100 mL/min/{1.73_m2} (ref >59)

## 2020-10-14 LAB — CBC
Hematocrit: 39.9 % (ref 34.0–46.6)
Hemoglobin: 13.5 g/dL (ref 11.1–15.9)
MCH: 26.7 pg (ref 26.6–33.0)
MCHC: 33.8 g/dL (ref 31.5–35.7)
MCV: 79 fL (ref 79–97)
Platelets: 232 10*3/uL (ref 150–450)
RBC: 5.06 x10E6/uL (ref 3.77–5.28)
RDW: 13.1 % (ref 11.7–15.4)
WBC: 4.5 10*3/uL (ref 3.4–10.8)

## 2020-10-14 LAB — LIPID PANEL
Chol/HDL Ratio: 3.4 ratio (ref 0.0–4.4)
Cholesterol, Total: 152 mg/dL (ref 100–199)
HDL: 45 mg/dL (ref >39)
LDL Chol Calc (NIH): 94 mg/dL (ref 0–99)
Triglycerides: 66 mg/dL (ref 0–149)
VLDL Cholesterol Cal: 13 mg/dL (ref 5–40)

## 2020-10-14 LAB — HEMOGLOBIN A1C
Est. average glucose Bld gHb Est-mCnc: 120 mg/dL
Hgb A1c MFr Bld: 5.8 % — ABNORMAL HIGH (ref 4.8–5.6)

## 2020-11-29 ENCOUNTER — Ambulatory Visit: Payer: 59 | Admitting: Family Medicine

## 2021-02-03 ENCOUNTER — Other Ambulatory Visit: Payer: Self-pay

## 2021-02-03 DIAGNOSIS — R7309 Other abnormal glucose: Secondary | ICD-10-CM

## 2021-02-06 ENCOUNTER — Other Ambulatory Visit: Payer: 59

## 2021-02-06 ENCOUNTER — Encounter: Payer: Self-pay | Admitting: Obstetrics and Gynecology

## 2021-02-13 ENCOUNTER — Other Ambulatory Visit: Payer: 59

## 2021-02-20 ENCOUNTER — Other Ambulatory Visit: Payer: 59

## 2021-04-07 ENCOUNTER — Other Ambulatory Visit: Payer: Self-pay

## 2021-04-07 ENCOUNTER — Ambulatory Visit
Admission: RE | Admit: 2021-04-07 | Discharge: 2021-04-07 | Disposition: A | Discharge status: Home / Self Care | Payer: 59 | Source: Ambulatory Visit | Attending: Obstetrics and Gynecology | Admitting: Obstetrics and Gynecology

## 2021-04-07 DIAGNOSIS — Z1231 Encounter for screening mammogram for malignant neoplasm of breast: Secondary | ICD-10-CM | POA: Insufficient documentation

## 2021-04-07 DIAGNOSIS — Z01419 Encounter for gynecological examination (general) (routine) without abnormal findings: Secondary | ICD-10-CM

## 2021-04-11 ENCOUNTER — Other Ambulatory Visit: Payer: Self-pay | Admitting: Obstetrics and Gynecology

## 2021-04-11 DIAGNOSIS — N6310 Unspecified lump in the right breast, unspecified quadrant: Secondary | ICD-10-CM

## 2021-04-11 DIAGNOSIS — R928 Other abnormal and inconclusive findings on diagnostic imaging of breast: Secondary | ICD-10-CM

## 2021-04-12 NOTE — Progress Notes (Signed)
Informed pt., all questions answered at this time.

## 2021-05-09 ENCOUNTER — Other Ambulatory Visit: Payer: Self-pay

## 2021-05-09 ENCOUNTER — Ambulatory Visit
Admission: RE | Admit: 2021-05-09 | Discharge: 2021-05-09 | Disposition: A | Discharge status: Home / Self Care | Payer: 59 | Source: Ambulatory Visit | Attending: Obstetrics and Gynecology | Admitting: Obstetrics and Gynecology

## 2021-05-09 DIAGNOSIS — R928 Other abnormal and inconclusive findings on diagnostic imaging of breast: Secondary | ICD-10-CM | POA: Diagnosis present

## 2021-05-09 DIAGNOSIS — N6310 Unspecified lump in the right breast, unspecified quadrant: Secondary | ICD-10-CM

## 2021-11-02 ENCOUNTER — Encounter: Payer: 59 | Admitting: Obstetrics and Gynecology

## 2022-03-02 ENCOUNTER — Ambulatory Visit
Admission: EM | Admit: 2022-03-02 | Discharge: 2022-03-02 | Disposition: A | Discharge status: Home / Self Care | Payer: 59 | Attending: Emergency Medicine | Admitting: Emergency Medicine

## 2022-03-02 DIAGNOSIS — L03116 Cellulitis of left lower limb: Secondary | ICD-10-CM | POA: Diagnosis not present

## 2022-03-02 MED ORDER — FLUCONAZOLE 150 MG PO TABS: ORAL_TABLET | ORAL | 0 refills | Status: DC

## 2022-03-02 MED ORDER — CEPHALEXIN 500 MG PO CAPS: 500.0000 mg | ORAL_CAPSULE | Freq: Four times a day (QID) | ORAL | 0 refills | Status: DC

## 2022-03-02 NOTE — ED Triage Notes (Signed)
Patient to Urgent Care with complaints of insect bite present to left lower leg x2 days. Reports waking up and her shin was itchy, red, and hurting. Has had some drainage. Reports her skin is prone to MRSA infections.   Recent travel from [place].   Denies any fevers.

## 2022-03-02 NOTE — ED Provider Notes (Signed)
Roderic Palau    CSN: [id number] Arrival date & time: 03/02/22  1926      History   Chief Complaint Chief Complaint  Patient presents with   Insect Bite    HPI Emily Howard is a 46 y.o. female.  Patient presents with redness and swelling of her left lower leg x2 days.  She believes this started as an insect bite.  The area is pruritic and painful.  No fever, chills, numbness, weakness, or other symptoms.  Treatment at home with topical ointment.  The history is provided by the patient and medical records.    Past Medical History:  Diagnosis Date   Fibroid, uterine    GERD (gastroesophageal reflux disease)    H/O hemorrhoidectomy    Hypotension    Hypothyroidism    Insomnia    Myalgia    Neutropenia (HCC)    Sickle cell trait (HCC)    Vitamin D deficiency     Patient Active Problem List   Diagnosis Date Noted   Prediabetes 10/14/2020   Fatigue 10/09/2019   Vitamin D deficiency 10/09/2019   Sickle cell trait ([place]) 10/09/2019   IUD (intrauterine device) in place 10/23/2017   Obesity 12/23/2015   Uterine fibroid 12/08/2014   Hypothyroidism 10/13/2014    Past Surgical History:  Procedure Laterality Date   HEMORRHOID SURGERY     THYROIDECTOMY  2010    OB History     Gravida  3   Para  3   Term  3   Preterm      AB      Living  3      SAB      IAB      Ectopic      Multiple      Live Births  3        Obstetric Comments  Fibroid uterus in pregnancy          Home Medications    Prior to Admission medications   Medication Sig Start Date End Date Taking? Authorizing Provider  cephALEXin (KEFLEX) 500 MG capsule Take 1 capsule (500 mg total) by mouth 4 (four) times daily. 03/02/22  Yes Sharion Balloon, NP  fluconazole (DIFLUCAN) 150 MG tablet Take as directed. 03/02/22  Yes Sharion Balloon, NP  calcitRIOL (ROCALTROL) 0.5 MCG capsule Take 0.5 mcg by mouth daily. 07/21/19   [provider]  levonorgestrel (MIRENA, 52  MG,) 20 MCG/24HR IUD 1 Intra Uterine Device (1 each total) by Intrauterine route once. 05/18/15   [name], MD  levothyroxine (SYNTHROID) 137 MCG tablet Take 137 mcg by mouth daily before breakfast.    [provider]  liothyronine (CYTOMEL) 5 MCG tablet Take 5 mcg by mouth daily.    [provider]  Multiple Vitamin (MULTI-VITAMINS PO) Take by mouth.    [provider]  Vitamin D, Ergocalciferol, (DRISDOL) 1.25 MG (50000 UNIT) CAPS capsule Take 50,000 Units by mouth once a week. 07/20/19   [provider]    Family History Family History  Problem Relation Age of Onset   Hypertension Mother    Arthritis Mother    Pancreatic cancer Father    Stroke Maternal Grandmother    Stroke Maternal Grandfather    Sickle cell anemia Sister    Sickle cell trait Son    Sickle cell trait Son    Breast cancer Neg Hx    Colon cancer Neg Hx     Social History Social History  Tobacco Use   Smoking status: Never   Smokeless tobacco: Never  Vaping Use   Vaping Use: Never used  Substance Use Topics   Alcohol use: Yes    Alcohol/week: 1.0 - 2.0 standard drink of alcohol    Types: 1 - 2 Glasses of wine per week   Drug use: No     Allergies   Astramorph [morphine] and Chloroquine   Review of Systems Review of Systems  Constitutional:  Negative for chills and fever.  Musculoskeletal:  Positive for joint swelling. Negative for arthralgias.  Skin:  Positive for color change and wound.  Neurological:  Negative for weakness and numbness.  All other systems reviewed and are negative.    Physical Exam Triage Vital Signs ED Triage Vitals  Enc Vitals Group     BP 03/02/22 1937 112/76     Pulse Rate 03/02/22 1933 96     Resp 03/02/22 1933 18     Temp 03/02/22 1933 98.4 F (36.9 C)     Temp src --      SpO2 03/02/22 1933 97 %     Weight 03/02/22 1935 170 lb (77.1 kg)     Height 03/02/22 1935 '5\' 5"'$  (1.651 m)     Head Circumference --      Peak Flow --       Pain Score 03/02/22 1931 9     Pain Loc --      Pain Edu? --      Excl. in [place]? --    No data found.  Updated Vital Signs BP 112/76   Pulse 96   Temp 98.4 F (36.9 C)   Resp 18   Ht '5\' 5"'$  (1.651 m)   Wt 170 lb (77.1 kg)   SpO2 97%   BMI 28.29 kg/m   Visual Acuity Right Eye Distance:   Left Eye Distance:   Bilateral Distance:    Right Eye Near:   Left Eye Near:    Bilateral Near:     Physical Exam Vitals and nursing note reviewed.  Constitutional:      General: She is not in acute distress.    Appearance: Normal appearance. She is well-developed. She is not ill-appearing.  HENT:     Mouth/Throat:     Mouth: Mucous membranes are moist.  Cardiovascular:     Rate and Rhythm: Normal rate and regular rhythm.     Heart sounds: Normal heart sounds.  Pulmonary:     Effort: Pulmonary effort is normal. No respiratory distress.     Breath sounds: Normal breath sounds.  Musculoskeletal:        General: Swelling and tenderness present. No deformity. Normal range of motion.     Cervical back: Neck supple.  Skin:    General: Skin is warm and dry.     Capillary Refill: Capillary refill takes less than 2 seconds.     Findings: Erythema and lesion present.     Comments: Papule with surrounding erythema on LLE. See picture.   Neurological:     General: No focal deficit present.     Mental Status: She is alert and oriented to person, place, and time.     Sensory: No sensory deficit.     Motor: No weakness.  Psychiatric:        Mood and Affect: Mood normal.        Behavior: Behavior normal.      UC Treatments / Results  Labs (all labs ordered are listed, but  only abnormal results are displayed) Labs Reviewed - No data to display  EKG   Radiology No results found.  Procedures Procedures (including critical care time)  Medications Ordered in UC Medications - No data to display  Initial Impression / Assessment and Plan / UC Course  I have reviewed the  triage vital signs and the nursing notes.  Pertinent labs & imaging results that were available during my care of the patient were reviewed by me and considered in my medical decision making (see chart for details).    Cellulitis of LLE.  Treating with cephalexin.  Per patient request, 1 tablet of Diflucan prescribed also as she reports she gets yeast infection when taking antibiotics.  Instructed her to follow-up with her PCP on Monday.  ED precautions discussed.  Education provided on cellulitis.  She agrees to plan of care.  Final Clinical Impressions(s) / UC Diagnoses   Final diagnoses:  Cellulitis of left lower leg     Discharge Instructions      Take the antibiotic as directed.  Follow up with your primary care provider on Monday.  Go to the emergency department if you have signs of worsening infection such as increased redness, fever, red streaks, or other concerning symptoms.     ED Prescriptions     Medication Sig Dispense Auth. Provider   cephALEXin (KEFLEX) 500 MG capsule Take 1 capsule (500 mg total) by mouth 4 (four) times daily. 28 capsule Barkley Boards H, NP   fluconazole (DIFLUCAN) 150 MG tablet Take as directed. 1 tablet Sharion Balloon, NP      PDMP not reviewed this encounter.   Sharion Balloon, NP 03/02/22 2000

## 2022-03-02 NOTE — Discharge Instructions (Addendum)
Take the antibiotic as directed.  Follow up with your primary care provider on Monday.  Go to the emergency department if you have signs of worsening infection such as increased redness, fever, red streaks, or other concerning symptoms.

## 2022-03-30 ENCOUNTER — Other Ambulatory Visit: Payer: Self-pay | Admitting: Obstetrics and Gynecology

## 2022-03-30 DIAGNOSIS — Z1231 Encounter for screening mammogram for malignant neoplasm of breast: Secondary | ICD-10-CM

## 2022-04-19 ENCOUNTER — Ambulatory Visit
Admission: RE | Admit: 2022-04-19 | Discharge: 2022-04-19 | Disposition: A | Discharge status: Home / Self Care | Payer: 59 | Source: Ambulatory Visit | Attending: Obstetrics and Gynecology | Admitting: Obstetrics and Gynecology

## 2022-04-19 DIAGNOSIS — Z1231 Encounter for screening mammogram for malignant neoplasm of breast: Secondary | ICD-10-CM | POA: Insufficient documentation

## 2022-05-14 ENCOUNTER — Encounter: Payer: Self-pay | Admitting: Obstetrics

## 2022-05-14 ENCOUNTER — Ambulatory Visit (INDEPENDENT_AMBULATORY_CARE_PROVIDER_SITE_OTHER): Payer: 59 | Admitting: Obstetrics

## 2022-05-14 VITALS — BP 107/55 | HR 79 | Ht 65.0 in | Wt 182.0 lb

## 2022-05-14 DIAGNOSIS — L708 Other acne: Secondary | ICD-10-CM | POA: Diagnosis not present

## 2022-05-14 DIAGNOSIS — E039 Hypothyroidism, unspecified: Secondary | ICD-10-CM

## 2022-05-14 DIAGNOSIS — E559 Vitamin D deficiency, unspecified: Secondary | ICD-10-CM

## 2022-05-14 DIAGNOSIS — Z01419 Encounter for gynecological examination (general) (routine) without abnormal findings: Secondary | ICD-10-CM

## 2022-05-14 NOTE — Progress Notes (Addendum)
SUBJECTIVE  HPI  Emily Howard is a 47 y.o.-year-old G3P3003 who presents for an annual gynecological exam today. She denies pelvic pain, dyspareunia, abnormal vaginal bleeding/discharge, and UTI symptoms. She reports that for the past 3-4 months, she has been getting a headache around her menses that she describes as "stuffy" and pounding. She denies photosensitivity, sensitivity to noise, and N/V. She also reports an increase in acne around her period and would like a referral to a dermatologist. She has a Mirena for contraception that has been in place for approximately 7 years. She feels her thyroid symptoms are currently well-controlled.  Medical/Surgical History Past Medical History:  Diagnosis Date   Fibroid, uterine    GERD (gastroesophageal reflux disease)    H/O hemorrhoidectomy    Hypotension    Hypothyroidism    Insomnia    Myalgia    Neutropenia (HCC)    Sickle cell trait (HCC)    Vitamin D deficiency    Past Surgical History:  Procedure Laterality Date   HEMORRHOID SURGERY     THYROIDECTOMY  2010    Social History Lives with her husband and 3 children; feels safe there. Work: works from home for [employer] ([employer]) Exercise: walking, yoga Substances: denies EtOH, vape, tobacco, and recreational drugs.  Obstetric History OB History     Gravida  3   Para  3   Term  3   Preterm      AB      Living  3      SAB      IAB      Ectopic      Multiple      Live Births  3        Obstetric Comments  Fibroid uterus in pregnancy          GYN/Menstrual History Patient's last menstrual period was 05/06/2022 (exact date). Regular monthly periods Last Pap: 09/23/2019. Normal cytology, HPV negative  Contraception: Mirena  Prevention Endorses regular dental and eye exams Mammogram: yearly. Normal mammo 04/19/22 Colonoscopy: desires Cologard  Current Medications Outpatient Medications Prior to Visit  Medication Sig   calcitRIOL  (ROCALTROL) 0.5 MCG capsule Take 0.5 mcg by mouth daily.   levonorgestrel (MIRENA, 52 MG,) 20 MCG/24HR IUD 1 Intra Uterine Device (1 each total) by Intrauterine route once.   levothyroxine (SYNTHROID) 137 MCG tablet Take 137 mcg by mouth daily before breakfast.   liothyronine (CYTOMEL) 5 MCG tablet Take 5 mcg by mouth daily.   Multiple Vitamin (MULTI-VITAMINS PO) Take by mouth.   Vitamin D, Ergocalciferol, (DRISDOL) 1.25 MG (50000 UNIT) CAPS capsule Take 50,000 Units by mouth once a week.   [DISCONTINUED] cephALEXin (KEFLEX) 500 MG capsule Take 1 capsule (500 mg total) by mouth 4 (four) times daily.   [DISCONTINUED] fluconazole (DIFLUCAN) 150 MG tablet Take as directed.   No facility-administered medications prior to visit.      The pregnancy intention screening data noted above was reviewed. Potential methods of contraception were discussed. The patient elected to proceed with IUD.   ROS Constitutional: Denied constitutional symptoms, night sweats, recent illness, fatigue, fever, insomnia and weight loss.  Eyes: Denied eye symptoms, eye pain, photophobia, vision change and visual disturbance.  Ears/Nose/Throat/Neck: Denied ear, nose, throat or neck symptoms, hearing loss, nasal discharge, sinus congestion and sore throat.  Cardiovascular: Denied cardiovascular symptoms, arrhythmia, chest pain/pressure, edema, exercise intolerance, orthopnea and palpitations.  Respiratory: Denied pulmonary symptoms, asthma, pleuritic pain, productive sputum, cough, dyspnea and wheezing.  Gastrointestinal: Denied, gastro-esophageal reflux,  melena, nausea and vomiting.  Genitourinary: Denied genitourinary symptoms including symptomatic vaginal discharge, pelvic relaxation issues, and urinary complaints.  Musculoskeletal: Denied musculoskeletal symptoms, stiffness, swelling, muscle weakness and myalgia.  Dermatologic: Denied rash and scar. +acne  Neurologic: Denied neurology symptoms, dizziness, headache, neck  pain and syncope.  Psychiatric: Denied psychiatric symptoms, anxiety and depression.  Endocrine: Denied endocrine symptoms including hot flashes and night sweats.    OBJECTIVE  BP (!) 107/55   Pulse 79   Ht '5\' 5"'$  (1.651 m)   Wt 182 lb (82.6 kg)   LMP 05/06/2022 (Exact Date)   BMI 30.29 kg/m   Last Weight  Most recent update: 05/14/2022  8:45 AM    Weight  82.6 kg (182 lb)             Body mass index is 30.29 kg/m.   Physical examination General NAD, Conversant  HEENT Atraumatic; Op clear with mmm.  Normo-cephalic. Pupils reactive. Anicteric sclerae  Thyroid/Neck Thyroid surgically removed 2010. Normal ROM.  Neck Supple.  Skin No rashes, lesions or ulceration. Normal palpated skin turgor. No nodularity.  Breasts: Exam declined d/t recent mammogram  Lungs: Clear to auscultation.No rales or wheezes. Normal Respiratory effort, no retractions.  Heart: NSR.  No murmurs or rubs appreciated. No peripheral edema  Abdomen: Soft.  Non-tender.  No masses.  No HSM. No hernia  Extremities: Moves all appropriately.  Normal ROM for age. No lymphadenopathy.  Neuro: Oriented to PPT.  Normal mood. Normal affect.       Pelvic:              Declined   ASSESSMENT  1) Annual exam 2) Menstrual headaches and acne  PLAN 1) Physical exam as noted. Declines STI testing. Pap due 09/2024. Discussed healthy lifestyle choices and preventive care. Cologard ordered. Anticipatory guidance about menopause 2) Discussed replacing Mirena. Recommend daily magnesium and B12 for headache prevention. Referral to dermatologist placed.  Return in one year for annual exam or as needed for concerns.   Lloyd Huger, CNM

## 2022-05-15 LAB — CBC
Hematocrit: 38.3 % (ref 34.0–46.6)
Hemoglobin: 12.7 g/dL (ref 11.1–15.9)
MCH: 26 pg — ABNORMAL LOW (ref 26.6–33.0)
MCHC: 33.2 g/dL (ref 31.5–35.7)
MCV: 78 fL — ABNORMAL LOW (ref 79–97)
Platelets: 213 10*3/uL (ref 150–450)
RBC: 4.89 x10E6/uL (ref 3.77–5.28)
RDW: 12.8 % (ref 11.7–15.4)
WBC: 3.6 10*3/uL (ref 3.4–10.8)

## 2022-05-15 LAB — COMPREHENSIVE METABOLIC PANEL
ALT: 20 IU/L (ref 0–32)
AST: 17 IU/L (ref 0–40)
Albumin/Globulin Ratio: 2.1 (ref 1.2–2.2)
Albumin: 4.2 g/dL (ref 3.9–4.9)
Alkaline Phosphatase: 30 IU/L — ABNORMAL LOW (ref 44–121)
BUN/Creatinine Ratio: 10 (ref 9–23)
BUN: 7 mg/dL (ref 6–24)
Bilirubin Total: 0.4 mg/dL (ref 0.0–1.2)
CO2: 23 mmol/L (ref 20–29)
Calcium: 8.6 mg/dL — ABNORMAL LOW (ref 8.7–10.2)
Chloride: 106 mmol/L (ref 96–106)
Creatinine, Ser: 0.7 mg/dL (ref 0.57–1.00)
Globulin, Total: 2 g/dL (ref 1.5–4.5)
Glucose: 88 mg/dL (ref 70–99)
Potassium: 4.1 mmol/L (ref 3.5–5.2)
Sodium: 140 mmol/L (ref 134–144)
Total Protein: 6.2 g/dL (ref 6.0–8.5)
eGFR: 107 mL/min/{1.73_m2} (ref >59)

## 2022-05-15 LAB — LIPID PANEL
Chol/HDL Ratio: 2.3 ratio (ref 0.0–4.4)
Cholesterol, Total: 135 mg/dL (ref 100–199)
HDL: 60 mg/dL (ref >39)
LDL Chol Calc (NIH): 67 mg/dL (ref 0–99)
Triglycerides: 29 mg/dL (ref 0–149)
VLDL Cholesterol Cal: 8 mg/dL (ref 5–40)

## 2022-05-15 LAB — HEMOGLOBIN A1C
Est. average glucose Bld gHb Est-mCnc: 117 mg/dL
Hgb A1c MFr Bld: 5.7 % — ABNORMAL HIGH (ref 4.8–5.6)

## 2022-05-16 ENCOUNTER — Encounter: Payer: Self-pay | Admitting: Obstetrics

## 2022-09-21 IMAGING — MG DIGITAL SCREENING BILAT W/ TOMO W/ CAD
8 series · 8 of 24 positions shown · non-contrast
Comparison: Previous exam(s).

CLINICAL DATA: Screening.

EXAM:
DIGITAL SCREENING BILATERAL MAMMOGRAM WITH TOMO AND CAD

[L CC synth-2D]
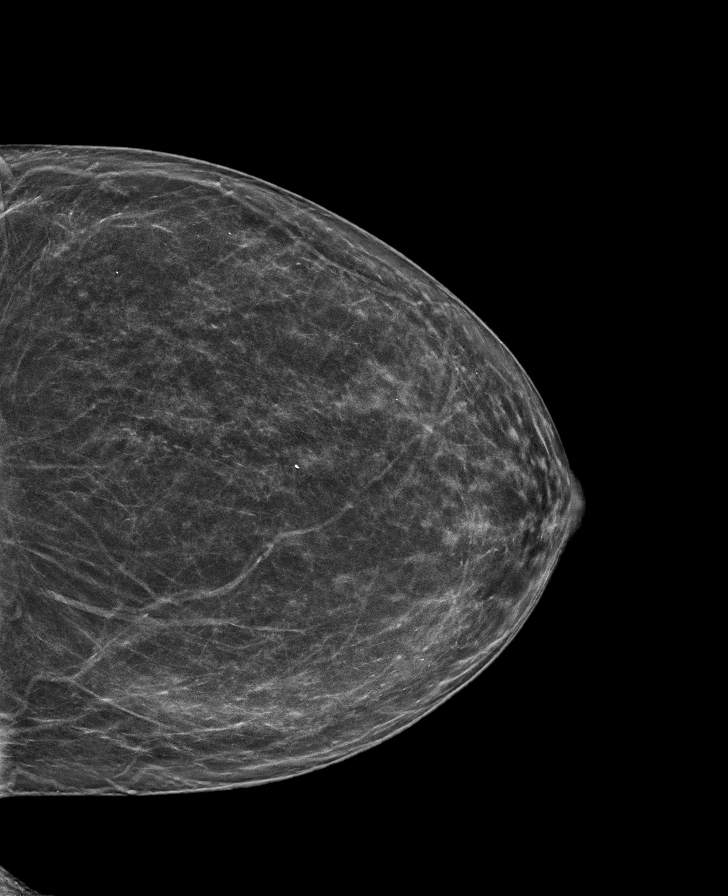

[L MLO synth-2D]
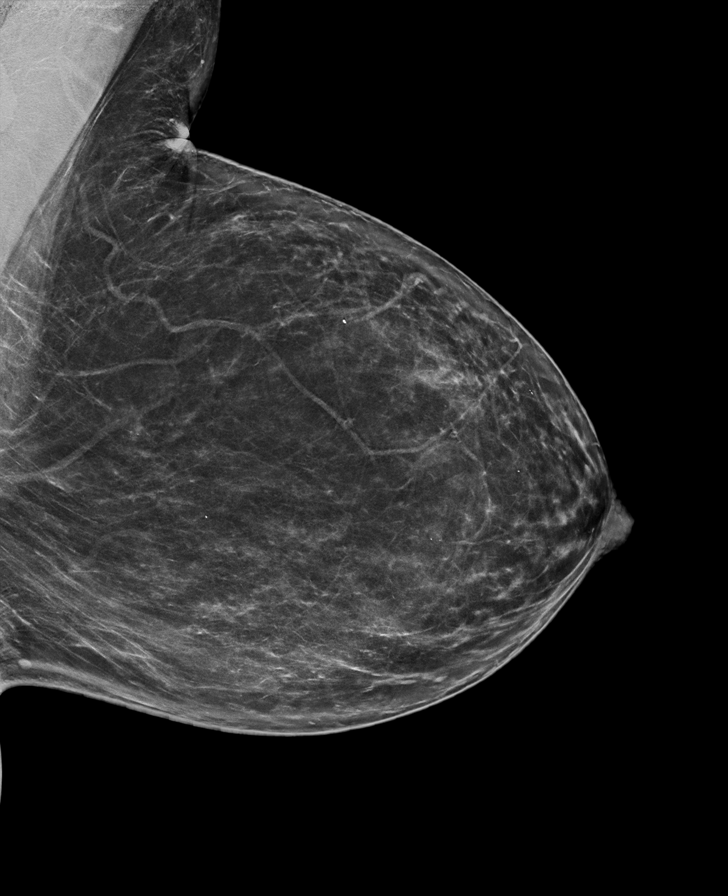

[R CC synth-2D]
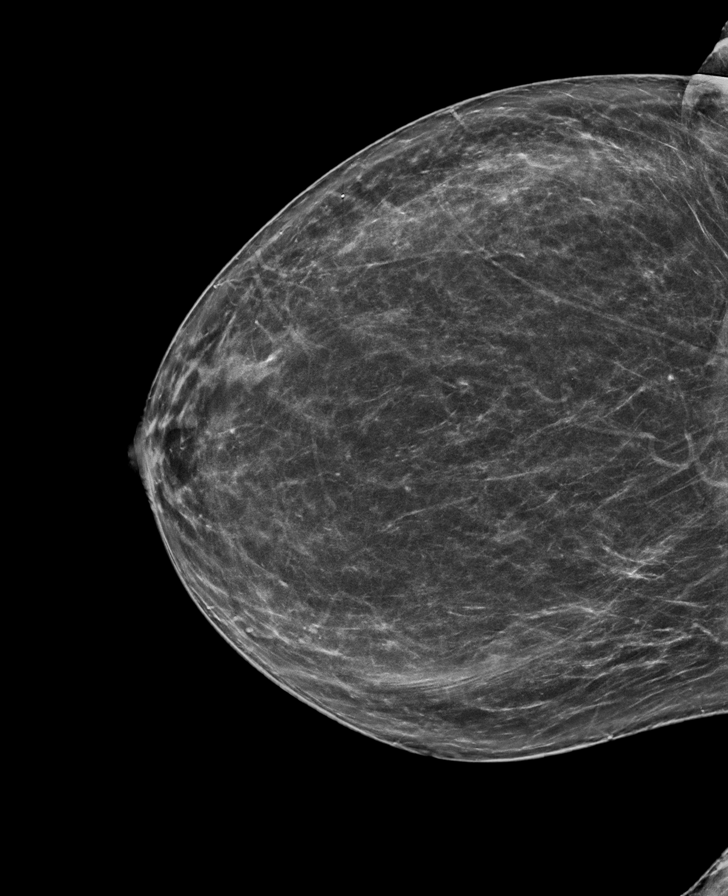

[R MLO synth-2D]
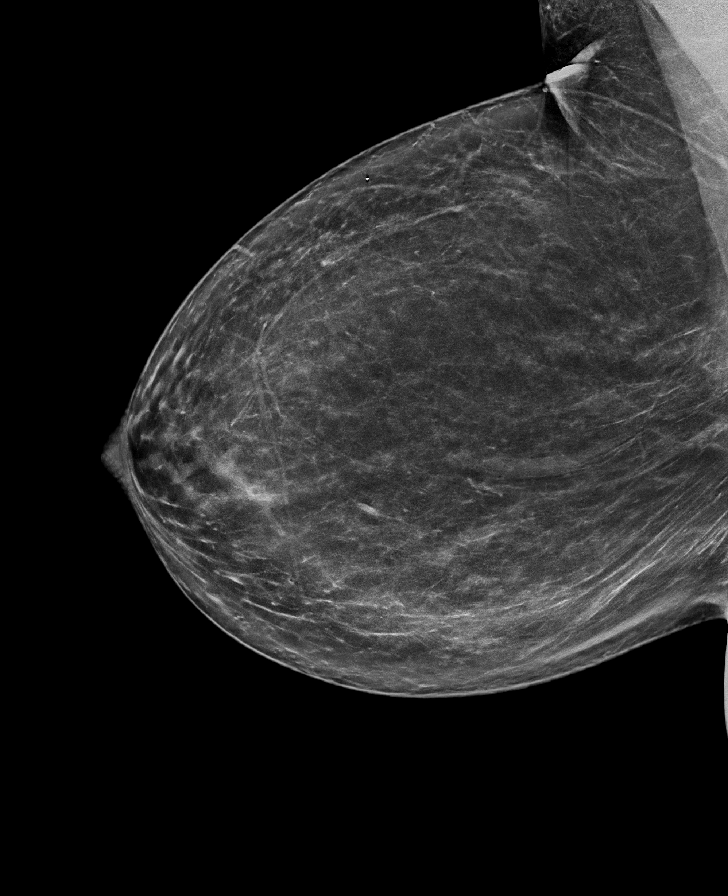

[L MLO tomo · tomo slice 41/80.0]
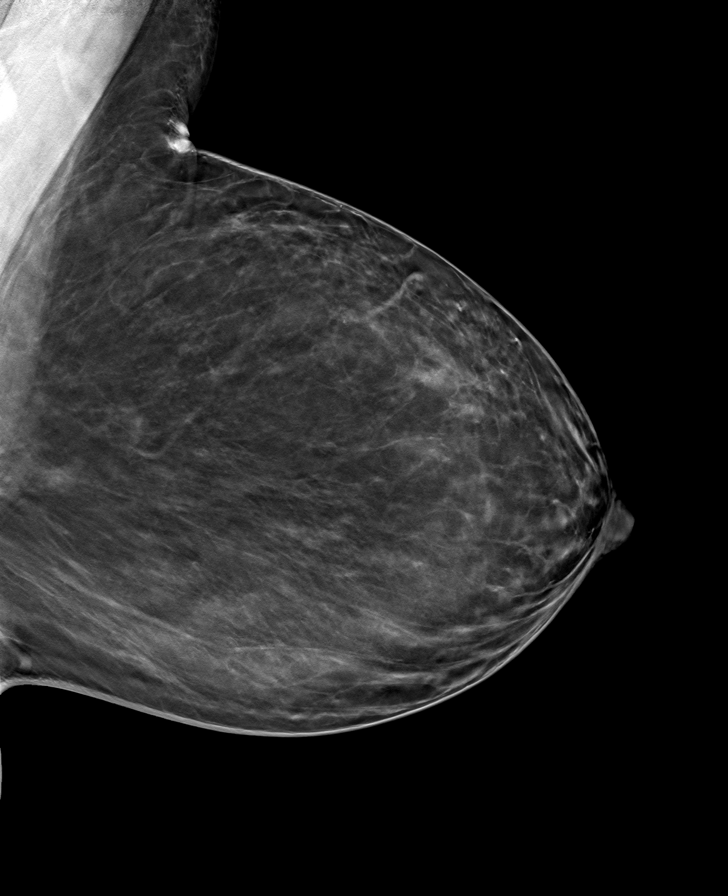

[R MLO tomo · tomo slice 43/86.0]
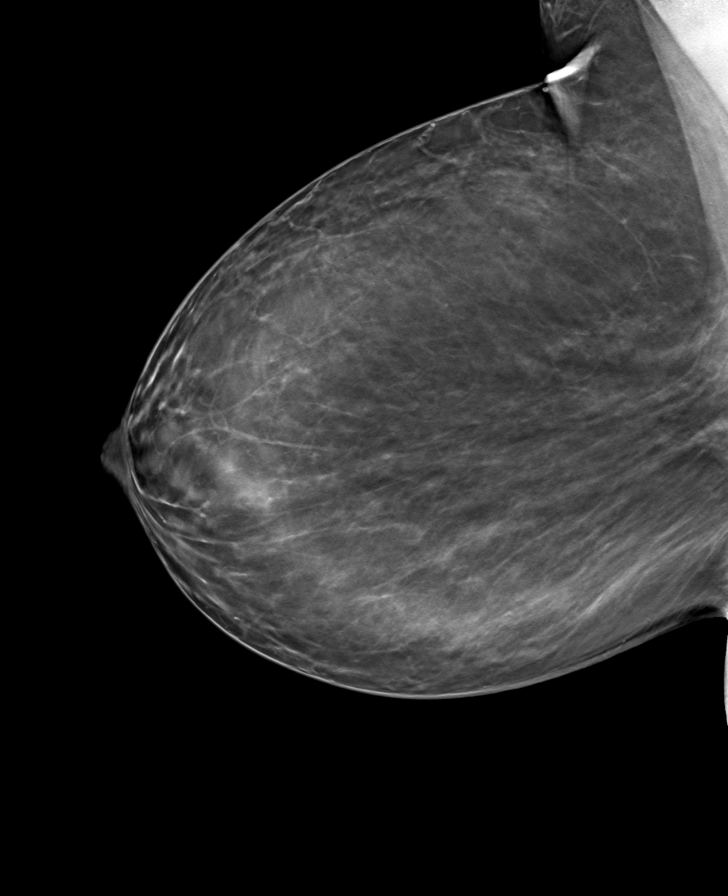

[R CC tomo · tomo slice 38/75.0]
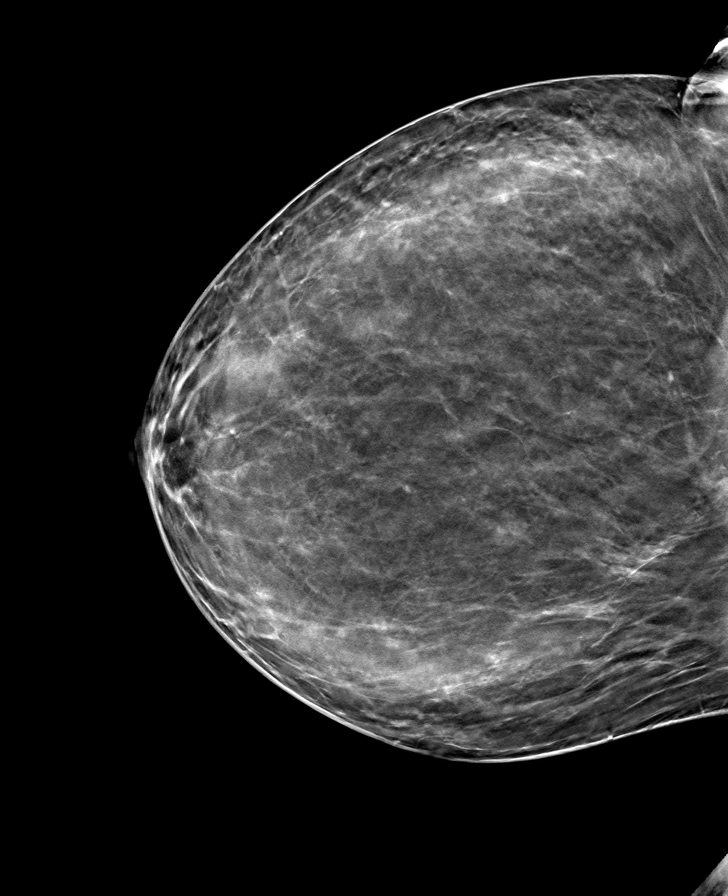

[L CC tomo · tomo slice 36/71.0]
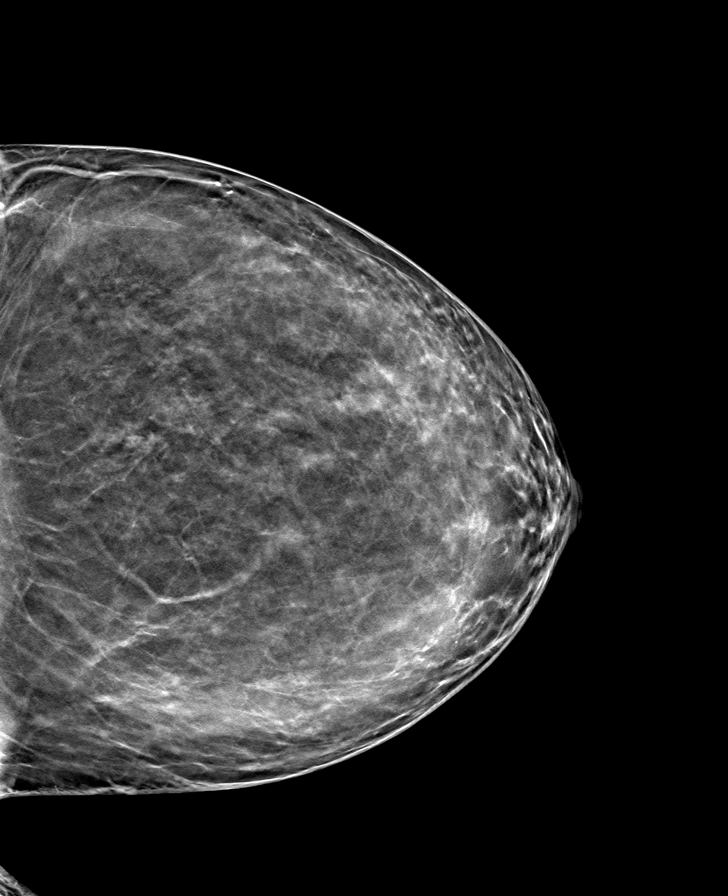

[8 of 24 positions shown; findings below may reference images not displayed]

ACR Breast Density Category b: There are scattered areas of
fibroglandular density.
FINDINGS: There are no findings suspicious for malignancy. Images were
processed with CAD.
IMPRESSION: No mammographic evidence of malignancy. A result letter of this
screening mammogram will be mailed directly to the patient.

RECOMMENDATION:
Screening mammogram in one year. (Code:[id number])

BI-RADS CATEGORY  1: Negative.

## 2022-10-17 LAB — COLOGUARD: COLOGUARD: NEGATIVE

## 2022-10-18 ENCOUNTER — Encounter: Payer: Self-pay | Admitting: Obstetrics

## 2022-11-07 ENCOUNTER — Telehealth: Payer: Self-pay

## 2022-11-07 NOTE — Telephone Encounter (Signed)
Called and spoke with patient in regards to setting up a new patient OV in order for patient to get a referral. OV scheduled for 01/08/23. Patient used to see Dr. Leonard Schwartz as her PCP,and  would like to know if her medical records were still on file at [facility]. She states that her current PCP office is closing and if any of her records are needed to reach out to her before the office closes in September. Please advise patient, [phone]

## 2022-11-07 NOTE — Telephone Encounter (Signed)
Copied from CRM [id number]. Topic: Referral - Request for Referral >> Nov 06, 2022  3:15 PM Everette C wrote: Has patient seen PCP for this complaint? No. *If NO, is insurance requiring patient see PCP for this issue before PCP can refer them? Referral for which specialty: Endocrinology  Preferred provider/office:[facility] - Dr. Verdis Frederickson  Reason for referral: thyroid concerns  Please fax to [phone] Phone [phone]

## 2022-11-08 NOTE — Telephone Encounter (Signed)
The only records that I see from her current PCP are from 06/28/2022 and a couple that were sent over in the past in media.  I called pt and let her know this and she is asking how much of her record you need because she will need to get that before coming to her new patient appointment with you because that office will be closed by then?  She states that she has been going there for 10 years.  Please advise on how many years you will need or what records you would like her to bring.

## 2022-12-31 ENCOUNTER — Telehealth: Payer: Self-pay | Admitting: Family Medicine

## 2023-01-08 ENCOUNTER — Ambulatory Visit: Payer: Self-pay | Admitting: Family Medicine

## 2023-01-11 ENCOUNTER — Ambulatory Visit: Payer: Self-pay | Admitting: Family Medicine

## 2023-01-15 ENCOUNTER — Encounter: Payer: Self-pay | Admitting: Family Medicine

## 2023-01-15 ENCOUNTER — Ambulatory Visit (INDEPENDENT_AMBULATORY_CARE_PROVIDER_SITE_OTHER): Payer: 59 | Admitting: Family Medicine

## 2023-01-15 VITALS — BP 113/80 | HR 81 | Ht 65.0 in | Wt 200.2 lb

## 2023-01-15 DIAGNOSIS — R7303 Prediabetes: Secondary | ICD-10-CM

## 2023-01-15 DIAGNOSIS — E039 Hypothyroidism, unspecified: Secondary | ICD-10-CM | POA: Diagnosis not present

## 2023-01-15 DIAGNOSIS — E6609 Other obesity due to excess calories: Secondary | ICD-10-CM

## 2023-01-15 DIAGNOSIS — E66811 Obesity, class 1: Secondary | ICD-10-CM | POA: Diagnosis not present

## 2023-01-15 DIAGNOSIS — D573 Sickle-cell trait: Secondary | ICD-10-CM

## 2023-01-15 DIAGNOSIS — E559 Vitamin D deficiency, unspecified: Secondary | ICD-10-CM

## 2023-01-15 DIAGNOSIS — N951 Menopausal and female climacteric states: Secondary | ICD-10-CM

## 2023-01-15 DIAGNOSIS — Z6833 Body mass index (BMI) 33.0-33.9, adult: Secondary | ICD-10-CM

## 2023-01-15 MED ORDER — LEVOTHYROXINE SODIUM 150 MCG PO TABS: 150.0000 ug | ORAL_TABLET | Freq: Every day | ORAL | Status: AC

## 2023-01-15 NOTE — Progress Notes (Signed)
New patient visit   Patient: Emily Howard   DOB: 26-Dec-1975   47 y.o. Female  MRN: [id number] Visit Date: 01/15/2023  Today's healthcare provider: Jacky Kindle, FNP  Patient presents for new patient visit to establish care.  Introduced to Publishing rights manager role and practice setting.  All questions answered.  Discussed provider/patient relationship and expectations.  Chief Complaint  Patient presents with   Establish Care    Would like to a referral to see an endocrinologist    Subjective    Emily Howard is a 47 y.o. female who presents today as a new patient to establish care.  HPI HPI     Establish Care    Additional comments: Would like to a referral to see an endocrinologist       Last edited by Rolly Salter, CMA on 01/15/2023  3:27 PM.      Emily Howard, a patient with a history of fibroids, reflux, hemorrhoids, low blood pressure, hypothyroidism, sleep concerns, and sickle cell trait, presents to re-establish care after a gap of more than three years. She expresses a desire to see an endocrinologist for her thyroid condition, which has been stable for a while. Her previous endocrinologist, Dr. Johny Chess, retired earlier this year. She has been on Synthroid at a dose of 150 mcg for her hypothyroidism and reports that her thyroid levels were last checked in 2022 and were stable.  In addition to her thyroid condition, the patient mentions experiencing premenopausal symptoms, including intense migraines that occur a week before her periods. These migraines are so severe that she needs to lie down in a dark, quiet room. She manages these migraines with over-the-counter ibuprofen. She also has an IUD in place and has not had any issues with her fibroids recently.  The patient is a carrier of the sickle cell trait and has two sons who are also carriers. She has not had any recent episodes of pain related to this condition. She also has a history of vitamin D  deficiency.  Past Medical History:  Diagnosis Date   Fibroid, uterine    GERD (gastroesophageal reflux disease)    H/O hemorrhoidectomy    Hypotension    Hypothyroidism    Insomnia    Myalgia    Neutropenia (HCC)    Sickle cell trait (HCC)    Vitamin D deficiency    Past Surgical History:  Procedure Laterality Date   HEMORRHOID SURGERY     THYROIDECTOMY  2010   Family Status  Relation Name Status   Mother  Alive   Father  Deceased at age 68   MGM  Deceased   MGF  Deceased   Sister  Nature conservation officer   Daughter  Alive   Son  Alive   Sister  Deceased at age 10   Son  Alive   Neg Hx  (Not Specified)  No partnership data on file   Family History  Problem Relation Age of Onset   Hypertension Mother    Arthritis Mother    Pancreatic cancer Father    Stroke Maternal Grandmother    Stroke Maternal Grandfather    Sickle cell anemia Sister    Sickle cell trait Son    Sickle cell trait Son    Breast cancer Neg Hx    Colon cancer Neg Hx    Social History   Socioeconomic History   Marital status: Married    Spouse name: Not on file   Number  of children: 3   Years of education: Not on file   Highest education level: Not on file  Occupational History   Occupation: Pensions consultant    Comment: [employer]  Tobacco Use   Smoking status: Never   Smokeless tobacco: Never  Vaping Use   Vaping status: Never Used  Substance and Sexual Activity   Alcohol use: Yes    Alcohol/week: 1.0 - 2.0 standard drink of alcohol    Types: 1 - 2 Glasses of wine per week   Drug use: No   Sexual activity: Yes    Partners: Male    Birth control/protection: I.U.D.    Comment: Mirena   Other Topics Concern   Not on file  Social History Narrative   Not on file   Social Determinants of Health   Financial Resource Strain: Not on file  Food Insecurity: Not on file  Transportation Needs: Not on file  Physical Activity: Not on file  Stress: Not on file  Social Connections: Not on  file   Outpatient Medications Prior to Visit  Medication Sig   calcitRIOL (ROCALTROL) 0.5 MCG capsule Take 0.5 mcg by mouth daily.   levonorgestrel (MIRENA, 52 MG,) 20 MCG/24HR IUD 1 Intra Uterine Device (1 each total) by Intrauterine route once.   liothyronine (CYTOMEL) 5 MCG tablet Take 5 mcg by mouth daily.   Multiple Vitamin (MULTI-VITAMINS PO) Take by mouth.   Vitamin D, Ergocalciferol, (DRISDOL) 1.25 MG (50000 UNIT) CAPS capsule Take 50,000 Units by mouth once a week.   [DISCONTINUED] levothyroxine (SYNTHROID) 137 MCG tablet Take 137 mcg by mouth daily before breakfast.   No facility-administered medications prior to visit.   Allergies  Allergen Reactions   Astramorph [Morphine] Itching    Itching for a week   Chloroquine Hives and Itching    Immunization History  Administered Date(s) Administered   Tdap 01/26/2015    Health Maintenance  Topic Date Due   COVID-19 Vaccine (1 - 2023-24 season) Never done   INFLUENZA VACCINE  07/15/2023 (Originally 11/15/2022)   Cervical Cancer Screening (HPV/Pap Cotest)  09/22/2024   DTaP/Tdap/Td (2 - Td or Tdap) 01/25/2025   Fecal DNA (Cologuard)  10/10/2025   Hepatitis C Screening  Completed   HIV Screening  Completed   HPV VACCINES  Aged Out    Patient Care Team: Erasmo Downer, MD as PCP - General (Family Medicine)   Objective    BP 113/80 (BP Location: Left Arm, Patient Position: Sitting, Cuff Size: Large)   Pulse 81   Ht 5\' 5"  (1.651 m)   Wt 200 lb 3.2 oz (90.8 kg)   BMI 33.32 kg/m   Physical Exam Vitals and nursing note reviewed.  Constitutional:      General: She is not in acute distress.    Appearance: Normal appearance. She is obese. She is not ill-appearing, toxic-appearing or diaphoretic.  HENT:     Head: Normocephalic and atraumatic.  Cardiovascular:     Rate and Rhythm: Normal rate and regular rhythm.     Pulses: Normal pulses.     Heart sounds: Normal heart sounds. No murmur heard.    No friction rub.  No gallop.  Pulmonary:     Effort: Pulmonary effort is normal. No respiratory distress.     Breath sounds: Normal breath sounds. No stridor. No wheezing, rhonchi or rales.  Chest:     Chest wall: No tenderness.  Musculoskeletal:        General: No swelling, tenderness, deformity or signs  of injury. Normal range of motion.     Right lower leg: No edema.     Left lower leg: No edema.  Skin:    General: Skin is warm and dry.     Capillary Refill: Capillary refill takes less than 2 seconds.     Coloration: Skin is not jaundiced or pale.     Findings: No bruising, erythema, lesion or rash.  Neurological:     General: No focal deficit present.     Mental Status: She is alert and oriented to person, place, and time. Mental status is at baseline.     Cranial Nerves: No cranial nerve deficit.     Sensory: No sensory deficit.     Motor: No weakness.     Coordination: Coordination normal.  Psychiatric:        Mood and Affect: Mood normal.        Behavior: Behavior normal.        Thought Content: Thought content normal.        Judgment: Judgment normal.    Depression Screen    01/15/2023    3:39 PM 10/09/2019    3:17 PM  PHQ 2/9 Scores  PHQ - 2 Score 0 0  PHQ- 9 Score  3   No results found for any visits on 01/15/23.  Assessment & Plan     Hypothyroidism Stable on Synthroid 150 mcg daily. Last thyroid function test in 2022 was normal. Patient's previous endocrinologist retired. -Order thyroid function tests today. -Consider referral to endocrinologist if patient desires.  Prediabetes Last HbA1c in 2022 was 5.7, indicating good control. -Monitor HbA1c periodically.  Menstrual Migraines Severe migraines occurring approximately one week before menstrual period. Currently managed with over-the-counter ibuprofen. -Consider hormonal therapy if migraines become unmanageable with ibuprofen.  General Health Maintenance -Continue Cologuard every three years (last done in Spring  2024). -Continue annual mammograms (last done in January 2024).  No follow-ups on file.    Leilani Merl, FNP, have reviewed all documentation for this visit. The documentation on 01/15/23 for the exam, diagnosis, procedures, and orders are all accurate and complete.  Jacky Kindle, FNP  [facility] [phone] (phone) [phone] (fax)  [facility]

## 2023-01-16 NOTE — Progress Notes (Signed)
TSH is currently elevated; continue to monitor symptoms. Can increase Synthroid to assist if concerns of fatigue, weight gain, tiredness, constipation, dry skin, etc. Or we can increase cytomel to 5 mcg TWICE daily from once daily.  Pre-diabetes remains on labs; Continue to recommend balanced, lower carb meals. Smaller meal size, adding snacks. Choosing water as drink of choice and increasing purposeful exercise.  The 10-year ASCVD risk score (Arnett DK, et al., 2019) is: 0.5% Cholesterol is well controlled at this time.  Vit D remains borderline low; recommend change to 5,000 IU Vit D 3 daily x 6 months with repeat labs form high dose to assist with absorption.   Hormonal labs all show peri-menopausal ranges.

## 2023-01-17 LAB — COMPREHENSIVE METABOLIC PANEL
ALT: 12 [IU]/L (ref 0–32)
AST: 16 [IU]/L (ref 0–40)
Albumin: 4.4 g/dL (ref 3.9–4.9)
Alkaline Phosphatase: 34 [IU]/L — ABNORMAL LOW (ref 44–121)
BUN/Creatinine Ratio: 14 (ref 9–23)
BUN: 11 mg/dL (ref 6–24)
Bilirubin Total: 0.3 mg/dL (ref 0.0–1.2)
CO2: 25 mmol/L (ref 20–29)
Calcium: 9.2 mg/dL (ref 8.7–10.2)
Chloride: 104 mmol/L (ref 96–106)
Creatinine, Ser: 0.78 mg/dL (ref 0.57–1.00)
Globulin, Total: 2.5 g/dL (ref 1.5–4.5)
Glucose: 67 mg/dL — ABNORMAL LOW (ref 70–99)
Potassium: 3.7 mmol/L (ref 3.5–5.2)
Sodium: 139 mmol/L (ref 134–144)
Total Protein: 6.9 g/dL (ref 6.0–8.5)
eGFR: 94 mL/min/{1.73_m2} (ref >59)

## 2023-01-17 LAB — LIPID PANEL
Chol/HDL Ratio: 2.6 {ratio} (ref 0.0–4.4)
Cholesterol, Total: 173 mg/dL (ref 100–199)
HDL: 66 mg/dL (ref >39)
LDL Chol Calc (NIH): 85 mg/dL (ref 0–99)
Triglycerides: 129 mg/dL (ref 0–149)
VLDL Cholesterol Cal: 22 mg/dL (ref 5–40)

## 2023-01-17 LAB — CBC WITH DIFFERENTIAL/PLATELET
Basophils Absolute: 0 10*3/uL (ref 0.0–0.2)
Basos: 1 %
EOS (ABSOLUTE): 0.2 10*3/uL (ref 0.0–0.4)
Eos: 3 %
Hematocrit: 41.4 % (ref 34.0–46.6)
Hemoglobin: 13.2 g/dL (ref 11.1–15.9)
Immature Grans (Abs): 0 10*3/uL (ref 0.0–0.1)
Immature Granulocytes: 0 %
Lymphocytes Absolute: 1.7 10*3/uL (ref 0.7–3.1)
Lymphs: 36 %
MCH: 26 pg — ABNORMAL LOW (ref 26.6–33.0)
MCHC: 31.9 g/dL (ref 31.5–35.7)
MCV: 82 fL (ref 79–97)
Monocytes Absolute: 0.4 10*3/uL (ref 0.1–0.9)
Monocytes: 8 %
Neutrophils Absolute: 2.5 10*3/uL (ref 1.4–7.0)
Neutrophils: 52 %
Platelets: 204 10*3/uL (ref 150–450)
RBC: 5.08 x10E6/uL (ref 3.77–5.28)
RDW: 13 % (ref 11.7–15.4)
WBC: 4.8 10*3/uL (ref 3.4–10.8)

## 2023-01-17 LAB — VITAMIN D 25 HYDROXY (VIT D DEFICIENCY, FRACTURES): Vit D, 25-Hydroxy: 32.2 ng/mL (ref 30.0–100.0)

## 2023-01-17 LAB — FSH/LH
FSH: 7.4 m[IU]/mL
LH: 2.7 m[IU]/mL

## 2023-01-17 LAB — THYROID PEROXIDASE ANTIBODY: Thyroperoxidase Ab SerPl-aCnc: <9 [IU]/mL (ref 0–34)

## 2023-01-17 LAB — ESTROGENS, TOTAL: Estrogen: 132 pg/mL

## 2023-01-17 LAB — TSH: TSH: 7.61 u[IU]/mL — ABNORMAL HIGH (ref 0.450–4.500)

## 2023-01-17 LAB — ESTRADIOL: Estradiol: 28.2 pg/mL

## 2023-01-17 LAB — PTH, INTACT AND CALCIUM: PTH: 26 pg/mL (ref 15–65)

## 2023-01-17 LAB — HEMOGLOBIN A1C
Est. average glucose Bld gHb Est-mCnc: 120 mg/dL
Hgb A1c MFr Bld: 5.8 % — ABNORMAL HIGH (ref 4.8–5.6)

## 2023-01-17 LAB — T4, FREE: Free T4: 1.29 ng/dL (ref 0.82–1.77)

## 2023-01-17 LAB — T3, FREE: T3, Free: 2.2 pg/mL (ref 2.0–4.4)

## 2023-04-24 ENCOUNTER — Other Ambulatory Visit: Payer: Self-pay | Admitting: Obstetrics and Gynecology

## 2023-04-24 DIAGNOSIS — Z1231 Encounter for screening mammogram for malignant neoplasm of breast: Secondary | ICD-10-CM

## 2023-05-01 ENCOUNTER — Ambulatory Visit
Admission: RE | Admit: 2023-05-01 | Discharge: 2023-05-01 | Disposition: A | Discharge status: Home / Self Care | Payer: 59 | Source: Ambulatory Visit | Attending: Obstetrics and Gynecology | Admitting: Obstetrics and Gynecology

## 2023-05-01 DIAGNOSIS — Z1231 Encounter for screening mammogram for malignant neoplasm of breast: Secondary | ICD-10-CM | POA: Diagnosis present

## 2023-05-05 ENCOUNTER — Encounter: Payer: Self-pay | Admitting: Obstetrics and Gynecology

## 2023-05-09 ENCOUNTER — Ambulatory Visit: Payer: 59 | Admitting: Dermatology

## 2023-05-20 ENCOUNTER — Ambulatory Visit: Payer: 59 | Admitting: Family Medicine

## 2023-05-20 VITALS — BP 121/84 | HR 73 | Wt 192.2 lb

## 2023-05-20 DIAGNOSIS — Z0001 Encounter for general adult medical examination with abnormal findings: Secondary | ICD-10-CM

## 2023-05-20 DIAGNOSIS — R7303 Prediabetes: Secondary | ICD-10-CM

## 2023-05-20 DIAGNOSIS — Z6833 Body mass index (BMI) 33.0-33.9, adult: Secondary | ICD-10-CM

## 2023-05-20 DIAGNOSIS — E039 Hypothyroidism, unspecified: Secondary | ICD-10-CM

## 2023-05-20 DIAGNOSIS — E559 Vitamin D deficiency, unspecified: Secondary | ICD-10-CM

## 2023-05-20 DIAGNOSIS — D573 Sickle-cell trait: Secondary | ICD-10-CM

## 2023-05-20 DIAGNOSIS — Z0000 Encounter for general adult medical examination without abnormal findings: Secondary | ICD-10-CM

## 2023-05-20 DIAGNOSIS — E66811 Obesity, class 1: Secondary | ICD-10-CM

## 2023-05-20 MED ORDER — VITAMIN D (ERGOCALCIFEROL) 1.25 MG (50000 UNIT) PO CAPS: 50000.0000 [IU] | ORAL_CAPSULE | ORAL | 1 refills | Status: AC

## 2023-05-20 NOTE — Progress Notes (Signed)
Complete physical exam   Patient: Emily Howard   DOB: [birth year]   48 y.o. Female  MRN: [id number] Visit Date: 05/20/2023  Today's healthcare provider: Shirlee Latch, MD   Chief Complaint  Patient presents with   Annual Exam    Diet - Low sodium and trying vegetarian Exercise - 3 times a week for 60 minutes Feeling - well Sleeping - well Concerns - none Please review food insecurity SDOH    Subjective    Emily Howard is a 48 y.o. female who presents today for a complete physical exam.   Discussed the use of AI scribe software for clinical note transcription with the patient, who gave verbal consent to proceed.  History of Present Illness   The patient, with a history of hypothyroidism, prediabetes, and an IUD, presents for a routine physical. She reports a recent episode of heavy menstruation, which she attributes to possible perimenopause. She has had an IUD for approximately eight years, which was placed after the birth of her eight-year-old daughter. The patient notes that her menstruation was typically light, like spotting, but recently became heavier. She has not had any changes to her thyroid medication and is due to see an endocrinologist at the end of February. She also reports taking a high dose of Vitamin D once a week.        Last depression screening scores    05/20/2023    3:30 PM 01/15/2023    3:39 PM 10/09/2019    3:17 PM  PHQ 2/9 Scores  PHQ - 2 Score 2 0 0  PHQ- 9 Score 5  3   Last fall risk screening    05/20/2023    3:30 PM  Fall Risk   Falls in the past year? 0  Number falls in past yr: 0  Injury with Fall? 0  Risk for fall due to : No Fall Risks  Follow up Falls evaluation completed        Medications: Outpatient Medications Prior to Visit  Medication Sig   calcitRIOL (ROCALTROL) 0.5 MCG capsule Take 0.5 mcg by mouth daily.   levonorgestrel (MIRENA, 52 MG,) 20 MCG/24HR IUD 1 Intra Uterine Device (1 each total) by  Intrauterine route once.   levothyroxine (SYNTHROID) 150 MCG tablet Take 1 tablet (150 mcg total) by mouth daily.   liothyronine (CYTOMEL) 5 MCG tablet Take 5 mcg by mouth daily.   Multiple Vitamin (MULTI-VITAMINS PO) Take by mouth.   [DISCONTINUED] Vitamin D, Ergocalciferol, (DRISDOL) 1.25 MG (50000 UNIT) CAPS capsule Take 50,000 Units by mouth once a week.   No facility-administered medications prior to visit.    Review of Systems    Objective    BP 121/84 (BP Location: Left Arm, Patient Position: Sitting, Cuff Size: Normal)   Pulse 73   Wt 192 lb 3.2 oz (87.2 kg)   LMP 05/17/2023   SpO2 100%   BMI 31.98 kg/m    Physical Exam Vitals reviewed.  Constitutional:      General: She is not in acute distress.    Appearance: Normal appearance. She is well-developed. She is not diaphoretic.  HENT:     Head: Normocephalic and atraumatic.     Right Ear: Tympanic membrane, ear canal and external ear normal.     Left Ear: Tympanic membrane, ear canal and external ear normal.     Nose: Nose normal.     Mouth/Throat:     Mouth: Mucous membranes are moist.     Pharynx: Oropharynx  is clear. No oropharyngeal exudate.  Eyes:     General: No scleral icterus.    Conjunctiva/sclera: Conjunctivae normal.     Pupils: Pupils are equal, round, and reactive to light.  Cardiovascular:     Rate and Rhythm: Normal rate and regular rhythm.     Heart sounds: Normal heart sounds. No murmur heard. Pulmonary:     Effort: Pulmonary effort is normal. No respiratory distress.     Breath sounds: Normal breath sounds. No wheezing or rales.  Abdominal:     General: There is no distension.     Palpations: Abdomen is soft.     Tenderness: There is no abdominal tenderness.  Musculoskeletal:        General: No deformity.     Cervical back: Neck supple.     Right lower leg: No edema.     Left lower leg: No edema.  Lymphadenopathy:     Cervical: No cervical adenopathy.  Skin:    General: Skin is warm  and dry.     Findings: No rash.  Neurological:     Mental Status: She is alert and oriented to person, place, and time. Mental status is at baseline.     Gait: Gait normal.  Psychiatric:        Mood and Affect: Mood normal.        Behavior: Behavior normal.        Thought Content: Thought content normal.      No results found for any visits on 05/20/23.  Assessment & Plan    Routine Health Maintenance and Physical Exam  Exercise Activities and Dietary recommendations  Goals   None     Immunization History  Administered Date(s) Administered   Tdap 01/26/2015    Health Maintenance  Topic Date Due   COVID-19 Vaccine (1 - 2024-25 season) Never done   INFLUENZA VACCINE  07/15/2023 (Originally 11/15/2022)   Cervical Cancer Screening (HPV/Pap Cotest)  09/22/2024   DTaP/Tdap/Td (2 - Td or Tdap) 01/25/2025   Fecal DNA (Cologuard)  10/10/2025   Hepatitis C Screening  Completed   HIV Screening  Completed   HPV VACCINES  Aged Out    Discussed health benefits of physical activity, and encouraged her to engage in regular exercise appropriate for her age and condition.  Problem List Items Addressed This Visit       Endocrine   Hypothyroidism (Chronic)     Other   Vitamin D deficiency   Sickle cell trait (HCC)   Prediabetes   Class 1 obesity due to excess calories with serious comorbidity and body mass index (BMI) of 33.0 to 33.9 in adult   Other Visit Diagnoses       Encounter for annual physical exam    -  Primary       Assessment and Plan    Perimenopause Recent episode of heavy menstrual bleeding, possibly indicative of perimenopause vs IUD nearing end of effective period may contribute to symptoms. Patient prefers to replace the IUD. - Discuss replacing Mirena IUD with Dr. Valentino Saxon  Thyroid Dysfunction Thyroid levels slightly off in recent labs. On thyroid medication, upcoming endocrinology appointment to discuss potential changes, including alternatives to  Cytomel. - Follow up with endocrinology appointment at the end of February  Prediabetes Confirmed by recent labs. Being monitored by endocrinology. - Follow up with endocrinology for ongoing management  Vitamin D Deficiency On high-dose vitamin D regimen once a week. Requires prescription refill. - Send prescription for high-dose vitamin D  to [facility] on [facility] Maintenance Up to date. Pap smear due 2026, Cologuard due 2027, recent mammogram. Tetanus shot due 2026. Declined flu shot, aware of COVID vaccine. - Schedule next physical for February 2026  Follow-up - Schedule next year's physical on the way out.        Return in about 1 year (around 05/19/2024) for CPE.     Shirlee Latch, MD  [facility] [phone] (phone) [phone] (fax)  [facility]

## 2023-07-24 NOTE — Progress Notes (Unsigned)
    GYNECOLOGY OFFICE PROCEDURE NOTE  Emily Howard is a 48 y.o. [id number] here for Mirena IUD removal and reinsertion. No GYN concerns.  Last pap smear was on 09/23/2019 and was normal.  IUD Removal and Reinsertion  Patient identified, informed consent performed, consent signed.   Discussed risks of irregular bleeding, cramping, infection, malpositioning or misplacement of the IUD outside the uterus which may require further procedures. Also discussed >99% contraception efficacy, increased risk of ectopic pregnancy with failure of method.   Emphasized that this did not protect against STIs, condoms recommended during all sexual encounters.Advised to use backup contraception for one week as the risk of pregnancy is higher during the transition period of removing an IUD and replacing it with another one. Time out was performed. Speculum placed in the vagina. The strings of the IUD were grasped and pulled using ring forceps. The IUD was successfully removed in its entirety. The cervix was cleaned with Betadine x 2 and grasped anteriorly with a single tooth tenaculum.  The new Mirena IUD insertion apparatus was used to sound the uterus to *** cm;  the IUD was then placed per manufacturer's recommendations. Strings trimmed to 3 cm. Tenaculum was removed, good hemostasis noted. Patient tolerated procedure well.   Patient was given post-procedure instructions.  She was reminded to have backup contraception for one week during this transition period between IUDs.  Patient was also asked to check IUD strings periodically and follow up in 4 weeks for IUD check.   Hildred Laser, MD [facility]

## 2023-07-24 NOTE — Patient Instructions (Signed)
 IUD PLACEMENT POST-PROCEDURE INSTRUCTIONS  You may take Ibuprofen, Aleve or Tylenol for pain if needed.  Cramping should resolve within in 24 hours.  You may have a small amount of spotting.  You should wear a mini pad for the next few days.  You may have intercourse after 24 hours.  If you using this for birth control, it is effective immediately.  You need to call if you have any pelvic pain, fever, heavy bleeding or foul smelling vaginal discharge.  Irregular bleeding is common the first several months after having an IUD placed. You do not need to call for this reason unless you are concerned.  Shower or bathe as normal  You should have a follow-up appointment in 4-8 weeks for a re-check to make sure you are not having any problems.    Hormonal Birth Control (Hormonal Contraception): What to Know Hormonal birth control, also called hormonal contraception, is a type of birth control that uses chemicals called hormones to prevent pregnancy. It may include a combination of the hormones estrogen and progesterone, or only the hormone progesterone. Hormonal birth control works in these ways: It thickens the mucus in the cervix, which is the lowest part of the uterus. Thicker mucus makes it harder for sperm to get into the uterus. It changes the lining of the uterus. This makes it harder for an egg to attach or implant. It may stop the ovaries from releasing eggs, called ovulation. Some people who take hormonal birth control that contains only progesterone may continue to ovulate. Hormonal birth control doesn't protect against sexually transmitted infections (STIs). Pregnancy may still happen. Types of hormonal birth control  Estrogen and progesterone birth control Birth control that use a combination of estrogen and progesterone is available as: Pills that come in different combinations of hormones. Pills must be taken at the same time each day. They can affect your period. You can get your  period monthly, once every 3 months, or not at all. A patch that is applied to the butt, belly, upper outer arm, or back. It's kept in place for 3 weeks. It's taken off for the last or fourth week of the menstrual cycle. A vaginal ring. The ring is placed in the vagina and left there for 3 weeks. It's then taken off for the last or fourth week of the cycle. Progesterone-only birth control Birth control that uses only progesterone is available as: Pills. These should be taken at the same time every day. This is very important to decrease the chance of pregnancy. Pills containing progestin-only are usually taken every day of the cycle. Other types of pills may have an inactive pill for the last 4 days of every cycle. Intrauterine device (IUD). This device is inserted through the vagina and cervix into the uterus. It's taken out or replaced every 3 to 8 years, depending on the type. It can be taken out sooner. Implant. A plastic rod is placed under the skin of the upper arm. It is taken out or replaced every 3 years. It can be taken out sooner. Shot, also called injection. The shot is given once every 12 to 14 weeks. Risks associated with hormonal birth control Estrogen and progesterone birth control can sometimes cause side effects, such as: Feeling like you may throw up. Headaches. Breast tenderness. Bleeding or spotting between menstrual cycles. High blood pressure. This is rare. Strokes, heart attacks, or blood clots. These are rare. Progesterone-only birth control can sometimes have side effects, such as: Feeling  like you may throw up. Headaches. Breast tenderness. Irregular menstrual bleeding. High blood pressure. This is rare. Talk to your health care provider about what side effects may mean for you. Questions to ask: What type of hormonal birth control is right for me? How long should I plan to use hormonal birth control? What are the side effects of the hormonal birth control method  I choose? How can I prevent STIs while using hormonal birth control? Where to find more information Ask your provider for more information and resources about hormonal birth control. You can also go to: [place] Department of Health and [employer], Office on Women's Health: http://hoffman.com/ This information is not intended to replace advice given to you by your health care provider. Make sure you discuss any questions you have with your health care provider. Document Revised: 10/15/2022 Document Reviewed: 10/15/2022 Elsevier Patient Education  2024 [employer].

## 2023-07-26 ENCOUNTER — Encounter: Payer: Self-pay | Admitting: Obstetrics and Gynecology

## 2023-07-26 ENCOUNTER — Other Ambulatory Visit (HOSPITAL_COMMUNITY)
Admission: RE | Admit: 2023-07-26 | Discharge: 2023-07-26 | Disposition: A | Discharge status: Home / Self Care | Source: Ambulatory Visit | Attending: Obstetrics and Gynecology | Admitting: Obstetrics and Gynecology

## 2023-07-26 ENCOUNTER — Ambulatory Visit: Admitting: Obstetrics and Gynecology

## 2023-07-26 VITALS — BP 116/66 | HR 73 | Resp 16 | Ht 64.0 in | Wt 196.4 lb

## 2023-07-26 DIAGNOSIS — Z124 Encounter for screening for malignant neoplasm of cervix: Secondary | ICD-10-CM | POA: Diagnosis present

## 2023-07-26 DIAGNOSIS — Z30433 Encounter for removal and reinsertion of intrauterine contraceptive device: Secondary | ICD-10-CM

## 2023-07-26 MED ORDER — LEVONORGESTREL 20 MCG/DAY IU IUD
1.0000 | INTRAUTERINE_SYSTEM | Freq: Once | INTRAUTERINE | Status: AC
  Administered 2023-07-26: 1 via INTRAUTERINE

## 2023-07-26 NOTE — Addendum Note (Signed)
 Addended by: Tommie Raymond on: 07/26/2023 01:37 PM   Modules accepted: Orders

## 2023-08-06 ENCOUNTER — Encounter: Payer: Self-pay | Admitting: Obstetrics and Gynecology

## 2023-08-06 LAB — CYTOLOGY - PAP
Comment: NEGATIVE
Comment: NEGATIVE
Comment: NEGATIVE
Diagnosis: NEGATIVE
HPV 16: NEGATIVE
HPV 18 / 45: NEGATIVE
High risk HPV: POSITIVE — AB

## 2023-08-23 ENCOUNTER — Ambulatory Visit: Admitting: Obstetrics

## 2023-08-23 ENCOUNTER — Ambulatory Visit

## 2023-09-02 ENCOUNTER — Ambulatory Visit

## 2023-09-02 DIAGNOSIS — Z30431 Encounter for routine checking of intrauterine contraceptive device: Secondary | ICD-10-CM

## 2023-09-16 ENCOUNTER — Ambulatory Visit: Admitting: Licensed Practical Nurse

## 2023-09-23 ENCOUNTER — Encounter: Payer: Self-pay | Admitting: Licensed Practical Nurse

## 2024-05-21 ENCOUNTER — Encounter: Payer: 59 | Admitting: Family Medicine

## 2024-05-26 ENCOUNTER — Encounter: Payer: 59 | Admitting: Family Medicine
